# Patient Record
Sex: Male | Born: 1950 | Race: White | Hispanic: No | Marital: Married | Smoking: Never smoker
Health system: Southern US, Community
[De-identification: ages and names within clinical notes are randomized; demographics above are authoritative.]

## PROBLEM LIST (undated history)

## (undated) DIAGNOSIS — N3289 Other specified disorders of bladder: Secondary | ICD-10-CM

## (undated) DIAGNOSIS — I739 Peripheral vascular disease, unspecified: Secondary | ICD-10-CM

## (undated) DIAGNOSIS — I2510 Atherosclerotic heart disease of native coronary artery without angina pectoris: Secondary | ICD-10-CM

## (undated) DIAGNOSIS — G4733 Obstructive sleep apnea (adult) (pediatric): Secondary | ICD-10-CM

## (undated) DIAGNOSIS — Z8709 Personal history of other diseases of the respiratory system: Secondary | ICD-10-CM

## (undated) DIAGNOSIS — I219 Acute myocardial infarction, unspecified: Secondary | ICD-10-CM

## (undated) DIAGNOSIS — Z9989 Dependence on other enabling machines and devices: Secondary | ICD-10-CM

## (undated) DIAGNOSIS — J449 Chronic obstructive pulmonary disease, unspecified: Secondary | ICD-10-CM

## (undated) DIAGNOSIS — E119 Type 2 diabetes mellitus without complications: Secondary | ICD-10-CM

## (undated) HISTORY — PX: COLONOSCOPY W/ POLYPECTOMY: SHX1380

---

## 2004-05-04 DIAGNOSIS — I739 Peripheral vascular disease, unspecified: Secondary | ICD-10-CM

## 2004-05-04 DIAGNOSIS — I219 Acute myocardial infarction, unspecified: Secondary | ICD-10-CM

## 2004-05-04 HISTORY — DX: Peripheral vascular disease, unspecified: I73.9

## 2004-05-04 HISTORY — PX: ANGIOPLASTY: SHX39

## 2004-05-04 HISTORY — DX: Acute myocardial infarction, unspecified: I21.9

## 2017-04-13 HISTORY — PX: OTHER SURGICAL HISTORY: SHX169

## 2017-08-16 DIAGNOSIS — G4733 Obstructive sleep apnea (adult) (pediatric): Secondary | ICD-10-CM

## 2017-08-16 DIAGNOSIS — Z9989 Dependence on other enabling machines and devices: Secondary | ICD-10-CM

## 2017-08-16 HISTORY — DX: Obstructive sleep apnea (adult) (pediatric): G47.33

## 2017-08-16 HISTORY — DX: Dependence on other enabling machines and devices: Z99.89

## 2017-11-01 DIAGNOSIS — Z8709 Personal history of other diseases of the respiratory system: Secondary | ICD-10-CM

## 2017-11-01 HISTORY — DX: Personal history of other diseases of the respiratory system: Z87.09

## 2017-11-01 HISTORY — PX: THORACENTESIS: SHX235

## 2017-11-06 ENCOUNTER — Encounter (HOSPITAL_COMMUNITY): Payer: Self-pay | Admitting: Internal Medicine

## 2017-11-06 ENCOUNTER — Other Ambulatory Visit: Payer: Self-pay

## 2017-11-06 ENCOUNTER — Inpatient Hospital Stay (HOSPITAL_COMMUNITY)
Admission: AD | Admit: 2017-11-06 | Discharge: 2017-11-07 | DRG: 920 | Disposition: A | Discharge status: Home / Self Care | Payer: Medicare Other | Source: Other Acute Inpatient Hospital | Attending: Family Medicine | Admitting: Family Medicine

## 2017-11-06 DIAGNOSIS — N329 Bladder disorder, unspecified: Secondary | ICD-10-CM | POA: Diagnosis present

## 2017-11-06 DIAGNOSIS — I10 Essential (primary) hypertension: Secondary | ICD-10-CM | POA: Diagnosis present

## 2017-11-06 DIAGNOSIS — Z955 Presence of coronary angioplasty implant and graft: Secondary | ICD-10-CM

## 2017-11-06 DIAGNOSIS — K91840 Postprocedural hemorrhage and hematoma of a digestive system organ or structure following a digestive system procedure: Principal | ICD-10-CM | POA: Diagnosis present

## 2017-11-06 DIAGNOSIS — N3289 Other specified disorders of bladder: Secondary | ICD-10-CM | POA: Diagnosis present

## 2017-11-06 DIAGNOSIS — Z794 Long term (current) use of insulin: Secondary | ICD-10-CM | POA: Diagnosis not present

## 2017-11-06 DIAGNOSIS — Z95810 Presence of automatic (implantable) cardiac defibrillator: Secondary | ICD-10-CM

## 2017-11-06 DIAGNOSIS — Y838 Other surgical procedures as the cause of abnormal reaction of the patient, or of later complication, without mention of misadventure at the time of the procedure: Secondary | ICD-10-CM | POA: Diagnosis not present

## 2017-11-06 DIAGNOSIS — Z79899 Other long term (current) drug therapy: Secondary | ICD-10-CM

## 2017-11-06 DIAGNOSIS — N179 Acute kidney failure, unspecified: Secondary | ICD-10-CM | POA: Diagnosis present

## 2017-11-06 DIAGNOSIS — Z7901 Long term (current) use of anticoagulants: Secondary | ICD-10-CM | POA: Diagnosis not present

## 2017-11-06 DIAGNOSIS — J449 Chronic obstructive pulmonary disease, unspecified: Secondary | ICD-10-CM | POA: Diagnosis present

## 2017-11-06 DIAGNOSIS — Z7951 Long term (current) use of inhaled steroids: Secondary | ICD-10-CM

## 2017-11-06 DIAGNOSIS — E119 Type 2 diabetes mellitus without complications: Secondary | ICD-10-CM | POA: Diagnosis not present

## 2017-11-06 DIAGNOSIS — J302 Other seasonal allergic rhinitis: Secondary | ICD-10-CM | POA: Diagnosis not present

## 2017-11-06 DIAGNOSIS — K922 Gastrointestinal hemorrhage, unspecified: Secondary | ICD-10-CM | POA: Diagnosis present

## 2017-11-06 DIAGNOSIS — K7689 Other specified diseases of liver: Secondary | ICD-10-CM | POA: Diagnosis present

## 2017-11-06 DIAGNOSIS — K921 Melena: Secondary | ICD-10-CM | POA: Diagnosis present

## 2017-11-06 DIAGNOSIS — I252 Old myocardial infarction: Secondary | ICD-10-CM

## 2017-11-06 DIAGNOSIS — Z7982 Long term (current) use of aspirin: Secondary | ICD-10-CM | POA: Diagnosis not present

## 2017-11-06 DIAGNOSIS — I2510 Atherosclerotic heart disease of native coronary artery without angina pectoris: Secondary | ICD-10-CM | POA: Diagnosis present

## 2017-11-06 DIAGNOSIS — Z888 Allergy status to other drugs, medicaments and biological substances status: Secondary | ICD-10-CM | POA: Diagnosis not present

## 2017-11-06 DIAGNOSIS — J90 Pleural effusion, not elsewhere classified: Secondary | ICD-10-CM | POA: Diagnosis present

## 2017-11-06 DIAGNOSIS — Z9889 Other specified postprocedural states: Secondary | ICD-10-CM

## 2017-11-06 HISTORY — DX: Obstructive sleep apnea (adult) (pediatric): G47.33

## 2017-11-06 HISTORY — DX: Dependence on other enabling machines and devices: Z99.89

## 2017-11-06 HISTORY — DX: Type 2 diabetes mellitus without complications: E11.9

## 2017-11-06 HISTORY — DX: Atherosclerotic heart disease of native coronary artery without angina pectoris: I25.10

## 2017-11-06 HISTORY — DX: Chronic obstructive pulmonary disease, unspecified: J44.9

## 2017-11-06 LAB — CBC
HEMATOCRIT: 30.7 % — AB (ref 39.0–52.0)
Hemoglobin: 9.9 g/dL — ABNORMAL LOW (ref 13.0–17.0)
MCH: 28.7 pg (ref 26.0–34.0)
MCHC: 32.2 g/dL (ref 30.0–36.0)
MCV: 89 fL (ref 78.0–100.0)
Platelets: 130 10*3/uL — ABNORMAL LOW (ref 150–400)
RBC: 3.45 MIL/uL — ABNORMAL LOW (ref 4.22–5.81)
RDW: 14.3 % (ref 11.5–15.5)
WBC: 6.9 10*3/uL (ref 4.0–10.5)

## 2017-11-06 LAB — GLUCOSE, CAPILLARY
GLUCOSE-CAPILLARY: 185 mg/dL — AB (ref 70–99)
GLUCOSE-CAPILLARY: 165 mg/dL — AB (ref 70–99)
Glucose-Capillary: 155 mg/dL — ABNORMAL HIGH (ref 70–99)

## 2017-11-06 LAB — PROTIME-INR
INR: 1.83
Prothrombin Time: 21 seconds — ABNORMAL HIGH (ref 11.4–15.2)

## 2017-11-06 LAB — MRSA PCR SCREENING: MRSA by PCR: NEGATIVE

## 2017-11-06 MED ORDER — PANTOPRAZOLE SODIUM 40 MG IV SOLR
40.0000 mg | Freq: Two times a day (BID) | INTRAVENOUS | Status: DC
  Administered 2017-11-06 (×2): 40 mg via INTRAVENOUS
  Filled 2017-11-06 (×5): qty 40

## 2017-11-06 MED ORDER — INSULIN ASPART 100 UNIT/ML ~~LOC~~ SOLN
0.0000 [IU] | Freq: Three times a day (TID) | SUBCUTANEOUS | Status: DC
  Administered 2017-11-06: 2 [IU] via SUBCUTANEOUS
  Administered 2017-11-07: 5 [IU] via SUBCUTANEOUS
  Administered 2017-11-07: 2 [IU] via SUBCUTANEOUS

## 2017-11-06 MED ORDER — FAMOTIDINE IN NACL 20-0.9 MG/50ML-% IV SOLN
20.0000 mg | Freq: Two times a day (BID) | INTRAVENOUS | Status: DC
Start: 2017-11-06 — End: 2017-11-06

## 2017-11-06 NOTE — H&P (Signed)
History and Physical    Nathaniel Adams BJY:[id number] DOB: October 20, 1950 DOA: 11/06/2017  PCP: Patient, No Pcp Per Patient coming from: HOME  Chief Complaint: Blood in stool  HPI: Nathaniel Adams is a 67 y.o. male with medical history significant of CAD,COPD,DEFIB PACER,DM transferred from [facility] emergency room where he went with complaints of 2 bowel movements that contained bright red blood.  patient has had no further bowel movements since yesterday.  He denies any prior episodes like this in the past.  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  He follows up with Dr. Rosemarie Ax  at the [facility] in [place] for cardiology.  He denies any nausea vomiting hematemesis.  He denies taking Motrin and ibuprofen.  However he does take aspirin 81 mg daily along with Coumadin at home.  He has no history of peptic ulcer disease in the past.  He had a colonoscopy on June 19 by Dr. Melina Copa in [place] he was told that he had 10 polyps.  He was also told that some of the polyps were removed.  Coumadin was on hold for 7 days at that time.  He denies any fever, chills, shortness of breath, headaches, abdominal pain, urinary complaints.  He complains of a dry cough.  His current medications are atorvastatin 80 mg daily, carvedilol 25 mg 1-1/2 tablets twice a day, vitamin D3, digoxin 0.125 MCG daily, fluticasone nasal spray, Lasix 20 mg once a day, NovoLog insulin 10 units before breakfast and 15 units before lunch and 12 units before supper, Lantus 38 units at bedtime, lovastatin 10 mg daily, metformin thousand milligrams twice a day, spironolactone 25 mg daily, valsartan 80 mg daily, Coumadin 5 mg except take 1-1/2 tablets on Wednesday.  ED Course: Patient was given Protonix and IV fluids.  CT of the abdomen and pelvis 3.8 cm polypoid mass in the left posterior bladder suspicious for carcinoma, several hyperenhancing nodules within  the right lobe of the liver further evaluation with a dedicated abdominal MRI should be considered.  Cardiomegaly with small right pleural effusion and moderate left pleural effusion.  Small amount of radiodense material within the stomach.  Sigmoid colon diverticula without acute inflammation.  Mild to moderate aortic atherosclerosis without aneurysm or dissection.  No significant vascular stenosis within the abdomen or pelvis.  Blood pressure was 148/66 pulse was 74 respiration 18 pulse ox 98% on room air temperature 97.8.  Globin was 10.2 came down to 9.5 when repeated while in the ER.  Sodium 136 potassium 5.2 BUN 35 creatinine 1.40.  Count is 142.  Review of Systems: Positive for generalized weakness Ambulatory Status: Patient is ambulatory at baseline and does all his ADLs by himself he is retired.  Past Medical History:  Diagnosis Date  . CAD (coronary artery disease)   . COPD (chronic obstructive pulmonary disease) (HCC)       Social History   Socioeconomic History  . Marital status: Married    Spouse name: Not on file  . Number of children: Not on file  . Years of education: Not on file  . Highest education level: Not on file  Occupational History  . Not on file  Social Needs  . Financial resource strain: Not on file  . Food insecurity:    Worry: Not on file    Inability: Not on file  . Transportation needs:    Medical:  Not on file    Non-medical: Not on file  Tobacco Use  . Smoking status: Not on file  Substance and Sexual Activity  . Alcohol use: Not on file  . Drug use: Not on file  . Sexual activity: Not on file  Lifestyle  . Physical activity:    Days per week: Not on file    Minutes per session: Not on file  . Stress: Not on file  Relationships  . Social connections:    Talks on phone: Not on file    Gets together: Not on file    Attends religious service: Not on file    Active member of club or organization: Not on file    Attends meetings of clubs or  organizations: Not on file    Relationship status: Not on file  . Intimate partner violence:    Fear of current or ex partner: Not on file    Emotionally abused: Not on file    Physically abused: Not on file    Forced sexual activity: Not on file  Other Topics Concern  . Not on file  Social History Narrative  . Not on file    Allergies not on file  No family history on file.    Prior to Admission medications   Not on File    Physical Exam: Vitals:   11/06/17 1153  BP: 130/71  Pulse: 75  Resp: 18  Temp: 98.1 F (36.7 C)  TempSrc: Oral  Weight: 46.2 kg (101 lb 12.8 oz)  Height: 5\' 9"  (1.753 m)     General: Appears calm and comfortable Eyes:  PERRL, EOMI, normal lids, iris ENT:  grossly normal hearing, lips & tongue, mmm Neck:  no LAD, masses or thyromegaly Cardiovascular:  RRR, no m/r/g. No LE edema. PACER LEFT UPPER CHEST PROTRUDED. Respiratory:  CTA bilaterally, no w/r/r. Normal respiratory effort. Abdomen:  soft, ntnd, NABS Skin:  no rash or induration seen on limited exam Musculoskeletal:  grossly normal tone BUE/BLE, good ROM, no bony abnormality Psychiatric:  grossly normal mood and affect, speech fluent and appropriate, AOx3 Neurologic:  CN 2-12 grossly intact, moves all extremities in coordinated fashion, sensation intact  Labs on Admission: I have personally reviewed following labs and imaging studies  CBC: No results for input(s): WBC, NEUTROABS, HGB, HCT, MCV, PLT in the last 168 hours. Basic Metabolic Panel: No results for input(s): NA, K, CL, CO2, GLUCOSE, BUN, CREATININE, CALCIUM, MG, PHOS in the last 168 hours. GFR: CrCl cannot be calculated (No order found.). Liver Function Tests: No results for input(s): AST, ALT, ALKPHOS, BILITOT, PROT, ALBUMIN in the last 168 hours. No results for input(s): LIPASE, AMYLASE in the last 168 hours. No results for input(s): AMMONIA in the last 168 hours. Coagulation Profile: No results for input(s): INR,  PROTIME in the last 168 hours. Cardiac Enzymes: No results for input(s): CKTOTAL, CKMB, CKMBINDEX, TROPONINI in the last 168 hours. BNP (last 3 results) No results for input(s): PROBNP in the last 8760 hours. HbA1C: No results for input(s): HGBA1C in the last 72 hours. CBG: No results for input(s): GLUCAP in the last 168 hours. Lipid Profile: No results for input(s): CHOL, HDL, LDLCALC, TRIG, CHOLHDL, LDLDIRECT in the last 72 hours. Thyroid Function Tests: No results for input(s): TSH, T4TOTAL, FREET4, T3FREE, THYROIDAB in the last 72 hours. Anemia Panel: No results for input(s): VITAMINB12, FOLATE, FERRITIN, TIBC, IRON, RETICCTPCT in the last 72 hours. Urine analysis: No results found for: COLORURINE, APPEARANCEUR, [place], [place], [place],  HGBUR, BILIRUBINUR, KETONESUR, PROTEINUR, UROBILINOGEN, NITRITE, LEUKOCYTESUR  Creatinine Clearance: CrCl cannot be calculated (No order found.).  Sepsis Labs: @LABRCNTIP (procalcitonin:4,lacticidven:4) )No results found for this or any previous visit (from the past 240 hour(s)).   Radiological Exams on Admission: No results found.  EKG: Independently reviewed.   Assessment/Plan Active Problems:   Upper GI bleed   1] GI bleed-patient admitted with melanotic stools.  He has no history of this in the past.  However he had colonoscopy June 19 and he feels may be some polyps were removed.  Consider  post polypectomy bleed.  I will hold his Coumadin and start him on IV Protonix.  Consult called into Dr. Alessandra Bevels.  I will place him on clear liquids at this time.  Check CBC INR BMP now.  2] history of CAD restart home medications  3] hypertension patient reports he is allergic to ACE inhibitors makes him cough.Restart carvidilol,digoxin.hold lasix.  4] seasonal allergies and takes Allegra at home.  5]bladder mass discussed with urology dr Jeffie Pollock he will see patient .  6]AKI hold metformin,  spironolactone, valsartan.  Follow-up levels  tomorrow.  7] liver nodules question metastatic.  Will need liver biopsy once GI bleeding resolved.  8] pleural effusion will schedule with IR to do a agnostic and therapeutic thoracentesis.  With the above findings of liver nodules and bladder mass rule out malignancy.  9] type 2 diabetes hold home insulin and start him on sliding scale insulin ~work-up is finished.  10] history of CAD and stent pacemaker and defibrillator patient takes Coumadin at home for this reason.  May need to obtain a cardiology consult on Monday to see if he really needs a Coumadin.  Hospital at [facility] to see his cardiologist.     DVT prophylaxis: SCD Code Status: Full code Family Communication: Discussed with his wife Disposition Plan: TBD Consults called: GI Admission status: Inpatient   Georgette Shell MD [facility]  If 7PM-7AM, please contact night-coverage [email or web address] Password [email or web address]  11/06/2017, 12:38 PM

## 2017-11-06 NOTE — Consult Note (Addendum)
Referring Provider:  [facility] Primary Care Physician:  Patient, No Pcp Per Primary Gastroenterologist:  Dr. Nance Pear  Reason for Consultation:  melena  HPI: Nathaniel Adams is a 67 y.o. male with past medical history of coronary artery disease, COPD, history of chronic anticoagulation with Coumadin transferred from [facility] for further evaluation of melanotic stools.  Patient seen and examined at bedside. He underwent Colonoscopy 10/20/2017 - Dr. Ashok Cordia . He was found to have more than 10 polyps. He had 1 large polyp which was removed and follow-up colonoscopy in 3 months is recommended to document complete polypectomy. His Coumadin was on hold for next 3 days after procedure. He was doing fine until yesterday when he noted bright red blood per rectum followed by 1 episode of dark color of stool. He denies nausea or vomiting. Denies any abdominal pain. Denies diarrhea or constipation. No history of ulcer disease. Denied NSAID use. Denies dysphagia or odynophagia.  CT scan abd/pelvis with IV contrast 07/05 showed no evidence of active extravasation within the bowel .3.8 cm mass in the bladder, concerning for carcinoma  as well as several hyper enhancing nodules within he tight lobe of the liver.  Hgb 10.2 on 07/05. INR 2.0  . Normal LFts. PLT 122 07/06 , 142 on 07/05)  Past Medical History:  Diagnosis Date  . CAD (coronary artery disease)   . COPD (chronic obstructive pulmonary disease) (HCC)    PSH : colonoscopy 10/20/2017  Family history. No family history of colon cancer.   Prior to Admission medications   Not on File    Scheduled Meds: . insulin aspart  0-9 Units Subcutaneous TID WC  . pantoprazole (PROTONIX) IV  40 mg Intravenous Q12H   Continuous Infusions:  PRN Meds:.  Allergies as of 11/06/2017  . (Not on File)    No family history on file.  Social History   Socioeconomic History  . Marital status: Married    Spouse name: Not on file  . Number of  children: Not on file  . Years of education: Not on file  . Highest education level: Not on file  Occupational History  . Not on file  Social Needs  . Financial resource strain: Not on file  . Food insecurity:    Worry: Not on file    Inability: Not on file  . Transportation needs:    Medical: Not on file    Non-medical: Not on file  Tobacco Use  . Smoking status: Not on file  Substance and Sexual Activity  . Alcohol use: Not on file  . Drug use: Not on file  . Sexual activity: Not on file  Lifestyle  . Physical activity:    Days per week: Not on file    Minutes per session: Not on file  . Stress: Not on file  Relationships  . Social connections:    Talks on phone: Not on file    Gets together: Not on file    Attends religious service: Not on file    Active member of club or organization: Not on file    Attends meetings of clubs or organizations: Not on file    Relationship status: Not on file  . Intimate partner violence:    Fear of current or ex partner: Not on file    Emotionally abused: Not on file    Physically abused: Not on file    Forced sexual activity: Not on file  Other Topics Concern  . Not on file  Social History Narrative  . Not on file    Review of Systems: Review of Systems  Constitutional: Negative for chills and fever.  HENT: Negative for hearing loss and tinnitus.   Eyes: Negative for blurred vision and double vision.  Respiratory: Negative for cough and hemoptysis.   Cardiovascular: Negative for chest pain and palpitations.  Gastrointestinal: Positive for blood in stool and melena. Negative for abdominal pain, heartburn, nausea and vomiting.  Genitourinary: Negative for dysuria and urgency.  Musculoskeletal: Negative for myalgias and neck pain.  Skin: Negative for rash.  Neurological: Negative for dizziness and headaches.  Endo/Heme/Allergies: Does not bruise/bleed easily.  Psychiatric/Behavioral: Negative for hallucinations and suicidal  ideas.    Physical Exam: Vital signs: Vitals:   11/06/17 1153  BP: 130/71  Pulse: 75  Resp: 18  Temp: 98.1 F (36.7 C)   Last BM Date: 11/05/17 Physical Exam  Constitutional: He is oriented to person, place, and time. He appears well-developed and well-nourished.  HENT:  Mouth/Throat: Oropharynx is clear and moist. No oropharyngeal exudate.  Eyes: EOM are normal. No scleral icterus.  Neck: Normal range of motion. Neck supple. No thyromegaly present.  Cardiovascular: Normal rate and regular rhythm.  Pulmonary/Chest: Effort normal and breath sounds normal. No respiratory distress.  Abdominal: Soft. Bowel sounds are normal. He exhibits no distension. There is no tenderness. There is no rebound and no guarding.  Musculoskeletal: Normal range of motion. He exhibits no edema.  Neurological: He is alert and oriented to person, place, and time.  Skin: Skin is warm. No erythema.  Psychiatric: He has a normal mood and affect. Judgment normal.  Vitals reviewed.   GI:  Lab Results: Recent Labs    11/06/17 1225  WBC 6.9  HGB 9.9*  HCT 30.7*  PLT 130*   BMET No results for input(s): NA, K, CL, CO2, GLUCOSE, BUN, CREATININE, CALCIUM in the last 72 hours. LFT No results for input(s): PROT, ALBUMIN, AST, ALT, ALKPHOS, BILITOT, BILIDIR, IBILI in the last 72 hours. PT/INR Recent Labs    11/06/17 1225  LABPROT 21.0*  INR 1.83     Studies/Results: No results found.  Impression/Plan: - bright red blood per rectum followed by 1 episode of black color stool in a patient who had colonoscopy on 10/20/2017 with resection of large polyp and multiple polyps. He was recommended for surveillance colonoscopy in 3 months to document complete polypectomy. Most likely post-polypectomy bleeding. - Chronic anticoagulation use. Coumadin currently on hold. - history of coronary artery disease - abnormal CT scan showing multiple liver lesions. - 3.8 cm mass in the bladder, concerning for  carcinoma  Recommendations ------------------------ - Continue conservative management for now. - Okay to start full liquid diet. - monitor H&H and INR. - If hemoglobin remains stable, hopefully we will be able to restart Coumadin from tomorrow. - Recommend outpatient MRI abdomen with contrast for further evaluation of liver lesion next few months. - Continue PPI for now.Most likely he has a post-polypectomy bleeding.  Records from [facility] reviewed from the paper chart. I do not have the colonoscopy report available to review. - GI will follow   LOS: 0 days   Otis Brace  MD, FACP 11/06/2017, 1:53 PM  Contact #  [phone]

## 2017-11-07 ENCOUNTER — Encounter (HOSPITAL_COMMUNITY): Payer: Self-pay | Admitting: *Deleted

## 2017-11-07 ENCOUNTER — Inpatient Hospital Stay (HOSPITAL_COMMUNITY): Payer: Medicare Other

## 2017-11-07 DIAGNOSIS — K91840 Postprocedural hemorrhage and hematoma of a digestive system organ or structure following a digestive system procedure: Secondary | ICD-10-CM | POA: Diagnosis not present

## 2017-11-07 DIAGNOSIS — K922 Gastrointestinal hemorrhage, unspecified: Secondary | ICD-10-CM

## 2017-11-07 DIAGNOSIS — K921 Melena: Secondary | ICD-10-CM | POA: Diagnosis not present

## 2017-11-07 DIAGNOSIS — N3289 Other specified disorders of bladder: Secondary | ICD-10-CM | POA: Diagnosis not present

## 2017-11-07 DIAGNOSIS — J449 Chronic obstructive pulmonary disease, unspecified: Secondary | ICD-10-CM | POA: Diagnosis present

## 2017-11-07 LAB — GLUCOSE, CAPILLARY
GLUCOSE-CAPILLARY: 266 mg/dL — AB (ref 70–99)
Glucose-Capillary: 178 mg/dL — ABNORMAL HIGH (ref 70–99)

## 2017-11-07 LAB — CBC
HEMATOCRIT: 29.8 % — AB (ref 39.0–52.0)
Hemoglobin: 9.6 g/dL — ABNORMAL LOW (ref 13.0–17.0)
MCH: 28.5 pg (ref 26.0–34.0)
MCHC: 32.2 g/dL (ref 30.0–36.0)
MCV: 88.4 fL (ref 78.0–100.0)
Platelets: 140 10*3/uL — ABNORMAL LOW (ref 150–400)
RBC: 3.37 MIL/uL — ABNORMAL LOW (ref 4.22–5.81)
RDW: 14 % (ref 11.5–15.5)
WBC: 6.6 10*3/uL (ref 4.0–10.5)

## 2017-11-07 LAB — COMPREHENSIVE METABOLIC PANEL
ALK PHOS: 62 U/L (ref 38–126)
ALT: 14 U/L (ref 0–44)
AST: 44 U/L — ABNORMAL HIGH (ref 15–41)
Albumin: 3.6 g/dL (ref 3.5–5.0)
Anion gap: 7 (ref 5–15)
BUN: 26 mg/dL — ABNORMAL HIGH (ref 8–23)
CALCIUM: 8.9 mg/dL (ref 8.9–10.3)
CO2: 26 mmol/L (ref 22–32)
Chloride: 102 mmol/L (ref 98–111)
Creatinine, Ser: 1.39 mg/dL — ABNORMAL HIGH (ref 0.61–1.24)
GFR, EST AFRICAN AMERICAN: 59 mL/min — AB (ref >60)
GFR, EST NON AFRICAN AMERICAN: 51 mL/min — AB (ref >60)
Glucose, Bld: 171 mg/dL — ABNORMAL HIGH (ref 70–99)
Potassium: 4.9 mmol/L (ref 3.5–5.1)
Sodium: 135 mmol/L (ref 135–145)
Total Bilirubin: 1 mg/dL (ref 0.3–1.2)
Total Protein: 6.1 g/dL — ABNORMAL LOW (ref 6.5–8.1)

## 2017-11-07 LAB — BODY FLUID CELL COUNT WITH DIFFERENTIAL
Lymphs, Fluid: 100 %
Monocyte-Macrophage-Serous Fluid: 0 % — ABNORMAL LOW (ref 50–90)
NEUTROPHIL FLUID: 0 % (ref 0–25)
Total Nucleated Cell Count, Fluid: 775 cu mm (ref 0–1000)

## 2017-11-07 LAB — LACTATE DEHYDROGENASE, PLEURAL OR PERITONEAL FLUID: LD, Fluid: 44 U/L — ABNORMAL HIGH (ref 3–23)

## 2017-11-07 LAB — HEMOGLOBIN AND HEMATOCRIT, BLOOD
HEMATOCRIT: 30.2 % — AB (ref 39.0–52.0)
HEMOGLOBIN: 9.8 g/dL — AB (ref 13.0–17.0)

## 2017-11-07 LAB — HIV ANTIBODY (ROUTINE TESTING W REFLEX): HIV Screen 4th Generation wRfx: NONREACTIVE

## 2017-11-07 LAB — PROTIME-INR
INR: 1.66
Prothrombin Time: 19.4 seconds — ABNORMAL HIGH (ref 11.4–15.2)

## 2017-11-07 LAB — GLUCOSE, PLEURAL OR PERITONEAL FLUID: Glucose, Fluid: 211 mg/dL

## 2017-11-07 MED ORDER — LIDOCAINE HCL (PF) 1 % IJ SOLN
INTRAMUSCULAR | Status: AC
  Filled 2017-11-07: qty 10

## 2017-11-07 MED ORDER — PANTOPRAZOLE SODIUM 40 MG PO TBEC
40.0000 mg | DELAYED_RELEASE_TABLET | Freq: Every day | ORAL | Status: DC
  Administered 2017-11-07: 40 mg via ORAL
  Filled 2017-11-07: qty 1

## 2017-11-07 NOTE — Discharge Summary (Signed)
Physician Discharge Summary  Nathaniel Adams:[id number] DOB: [birth year] DOA: 11/06/2017  PCP: Patient, No Pcp Per  Admit date: 11/06/2017 Discharge date: 11/07/2017  Time spent: 35 minutes  Recommendations for Outpatient Follow-up:  1. Follow-up on thoracentesis work-up for possible malignancy in etiology 2. Holding Coumadin for cystoscopy with biopsy of bladder mass with Dr. Roni Bread this week 3. Patient needs outpatient MRI with contrast for further evaluation of liver lesion per GI   Discharge Diagnoses:  Principal Problem:   Upper GI bleed Active Problems:   Bladder mass   DM (diabetes mellitus), type 2 (HCC)   COPD (chronic obstructive pulmonary disease) ([place])   Discharge Condition: Stable and improved  Diet recommendation: Healthy heart  Filed Weights   11/06/17 1153  Weight: 46.2 kg (101 lb 12.8 oz)    History of present illness and hospital course 67 year old male presented to the emergency department with melanotic stools likely secondary to post polypectomy after colonoscopy done June 19 for which he had 10 polyps removed.  GI was consulted and due to patient's stability in his hemoglobin which was almost 10 throughout his hospitalization no further work-up was advised.  Patient's Coumadin was held.  Patient also incidentally found to have a bladder mass.  Spoken to Dr. Roni Bread with urology will arrange for him to follow-up in outpatient setting in order to get a cystoscopy with biopsy.  For this reason I will continue to hold his Coumadin.  Patient is instructed to follow-up with his primary care physician in approximately a week to resume his Coumadin at that point.  Patient is being discharged in stable and improved condition.   Discharge Exam: Vitals:   11/07/17 1011 11/07/17 1150  BP: 139/68 133/67  Pulse: 84 67  Resp:  18  Temp:  97.6 F (36.4 C)  SpO2:  98%    General: Alert and oriented x4 no apparent distress cooperative and friendly Cardiovascular: Regular  rate and rhythm without murmurs rubs or gallops Respiratory: Clear to auscultation bilaterally no wheezes rhonchi rales  Discharge Instructions   Discharge Instructions    Diet - low sodium heart healthy   Complete by:  As directed    Discharge instructions   Complete by:  As directed    Continue to hold coumadin , dr Roni Bread our urologist will arrange for follow up for biopsy in his office this week   Increase activity slowly   Complete by:  As directed      Allergies as of 11/07/2017   Not on File     Medication List    STOP taking these medications   warfarin 5 MG tablet Commonly known as:  COUMADIN     TAKE these medications   acetaminophen 500 MG tablet Commonly known as:  TYLENOL Take 1,000 mg by mouth every 8 (eight) hours as needed for mild pain or headache.   atorvastatin 80 MG tablet Commonly known as:  LIPITOR Take 40 mg by mouth daily at 6 PM.   carvedilol 25 MG tablet Commonly known as:  COREG Take 37.5 mg by mouth 2 (two) times daily with a meal.   cholecalciferol 1000 units tablet Commonly known as:  VITAMIN D Take 1,000 Units by mouth daily.   digoxin 0.125 MG tablet Commonly known as:  LANOXIN Take 0.125 mg by mouth daily.   fluticasone 50 MCG/ACT nasal spray Commonly known as:  FLONASE Place 2 sprays into both nostrils daily.   furosemide 20 MG tablet Commonly known as:  LASIX Take  20 mg by mouth daily.   insulin aspart 100 UNIT/ML injection Commonly known as:  novoLOG Inject 10-15 Units into the skin See admin instructions. Taking 10 units in the AM before Breakfast, 15 units at Lunch and 12 units in the PM.   insulin glargine 100 UNIT/ML injection Commonly known as:  LANTUS Inject 38 Units into the skin at bedtime.   loratadine 10 MG tablet Commonly known as:  CLARITIN Take 10 mg by mouth daily.   metFORMIN 1000 MG tablet Commonly known as:  GLUCOPHAGE Take 1,000 mg by mouth 2 (two) times daily with a meal.   spironolactone 25 MG  tablet Commonly known as:  ALDACTONE Take 25 mg by mouth daily.   valsartan 80 MG tablet Commonly known as:  DIOVAN Take 40 mg by mouth 2 (two) times daily.      Not on File Follow-up Information    Irine Seal, MD.   Specialty:  Urology Why:  Please call my office in the morning to arrange and appointment.  I will try to get it set up for you to be see on Monday or Tuesday. Contact information: [address] [place] [address] [phone]        PCP Follow up in 1 week(s).            The results of significant diagnostics from this hospitalization (including imaging, microbiology, ancillary and laboratory) are listed below for reference.    Significant Diagnostic Studies: Dg Chest 1 View  Result Date: 11/07/2017 CLINICAL DATA:  Post left-sided thoracentesis. EXAM: CHEST  1 VIEW COMPARISON:  CT abdomen and pelvis - 11/06/2017 FINDINGS: Grossly unchanged cardiac silhouette and mediastinal contours. Stable positioning of support apparatus. Interval reduction/resolution of left-sided pleural effusion post thoracentesis. Unchanged small right-sided pleural effusion. Persistent bibasilar heterogeneous/consolidative opacities. Pulmonary vasculature remains indistinct with cephalization of flow. No pneumothorax. No acute osseus abnormalities. IMPRESSION: 1. Interval reduction/resolution of left-sided pleural effusion post thoracentesis. No pneumothorax. 2. Similar findings suggestive of pulmonary edema with small residual right-sided effusion and associated bibasilar opacities, likely atelectasis. Electronically Signed   By: Sandi Mariscal M.D.   On: 11/07/2017 10:29   US Thoracentesis Asp Pleural Space W/img Guide  Result Date: 11/07/2017 INDICATION: Patient with history of GI bleed, coronary artery disease, bladder mass, liver lesions, bilateral pleural effusions left greater than right. Request made for diagnostic and therapeutic left thoracentesis. EXAM: ULTRASOUND GUIDED DIAGNOSTIC  AND THERAPEUTIC LEFT THORACENTESIS MEDICATIONS: None COMPLICATIONS: None immediate. PROCEDURE: An ultrasound guided thoracentesis was thoroughly discussed with the patient and questions answered. The benefits, risks, alternatives and complications were also discussed. The patient understands and wishes to proceed with the procedure. Written consent was obtained. Ultrasound was performed to localize and mark an adequate pocket of fluid in the left chest. The area was then prepped and draped in the normal sterile fashion. 1% Lidocaine was used for local anesthesia. Under ultrasound guidance a 6 Fr Safe-T-Centesis catheter was introduced. Thoracentesis was performed. The catheter was removed and a dressing applied. FINDINGS: A total of approximately 1.2 liters of hazy, amber fluid was removed. Samples were sent to the laboratory as requested by the clinical team. IMPRESSION: Successful ultrasound guided diagnostic and therapeutic left thoracentesis yielding 1.2 liters of pleural fluid. Read by: Rowe Robert, PA-C Electronically Signed   By: Sandi Mariscal M.D.   On: 11/07/2017 10:09    Microbiology: Recent Results (from the past 240 hour(s))  MRSA PCR Screening     Status: None   Collection  Time: 11/06/17  1:07 PM  Result Value Ref Range Status   MRSA by PCR NEGATIVE NEGATIVE Final    Comment:        The GeneXpert MRSA Assay (FDA approved for NASAL specimens only), is one component of a comprehensive MRSA colonization surveillance program. It is not intended to diagnose MRSA infection nor to guide or monitor treatment for MRSA infections. Performed at [facility], [place] [address] [place], [place] [address]   Body fluid culture     Status: None (Preliminary result)   Collection Time: 11/07/17 10:16 AM  Result Value Ref Range Status   Specimen Description PLEURAL  Final   Special Requests NONE  Final   Gram Stain   Final    WBC PRESENT,BOTH PMN AND MONONUCLEAR NO ORGANISMS SEEN CYTOSPIN  SMEAR Performed at [facility], [place] [address], [place], [place] [address]    Culture PENDING  Incomplete   Report Status PENDING  Incomplete     Labs: Basic Metabolic Panel: Recent Labs  Lab 11/07/17 0325  NA 135  K 4.9  CL 102  CO2 26  GLUCOSE 171*  BUN 26*  CREATININE 1.39*  CALCIUM 8.9   Liver Function Tests: Recent Labs  Lab 11/07/17 0325  AST 44*  ALT 14  ALKPHOS 62  BILITOT 1.0  PROT 6.1*  ALBUMIN 3.6   No results for input(s): LIPASE, AMYLASE in the last 168 hours. No results for input(s): AMMONIA in the last 168 hours. CBC: Recent Labs  Lab 11/06/17 1225 11/07/17 0325 11/07/17 1148  WBC 6.9 6.6  --   HGB 9.9* 9.6* 9.8*  HCT 30.7* 29.8* 30.2*  MCV 89.0 88.4  --   PLT 130* 140*  --    Cardiac Enzymes: No results for input(s): CKTOTAL, CKMB, CKMBINDEX, TROPONINI in the last 168 hours. BNP: BNP (last 3 results) No results for input(s): BNP in the last 8760 hours.  ProBNP (last 3 results) No results for input(s): PROBNP in the last 8760 hours.  CBG: Recent Labs  Lab 11/06/17 1316 11/06/17 1721 11/06/17 2124 11/07/17 0725 11/07/17 1134  GLUCAP 155* 185* 165* 178* 266*       Signed:  DAVID,RACHAL A MD.  [facility] 11/07/2017, 3:47 PM

## 2017-11-07 NOTE — Progress Notes (Signed)
[facility] Gastroenterology Progress Note  Nathaniel Adams 67 y.o. [birth year]  CC:  GI bleed   Subjective: no further bleeding episodes. Patient denies any GI symptoms. Denied black stool, bright blood per rectum. Denies nausea, vomiting, abdominal pain.  ROS : negative for chest pain and palpitation.   Objective: Vital signs in last 24 hours: Vitals:   11/07/17 0312 11/07/17 0749  BP: (!) 123/55 111/85  Pulse: 70 71  Resp: 16 20  Temp: 97.8 F (36.6 C) 97.7 F (36.5 C)  SpO2: 98% 97%    Physical Exam:  Gen. Alert/oriented 3. Not in acute distress Abdominal pain soft, nontender, nondistended, bowel sounds present. No peritoneal signs Lower extremity.no edema Psych- Mood and affect normal  Lab Results: Recent Labs    11/07/17 0325  NA 135  K 4.9  CL 102  CO2 26  GLUCOSE 171*  BUN 26*  CREATININE 1.39*  CALCIUM 8.9   Recent Labs    11/07/17 0325  AST 44*  ALT 14  ALKPHOS 62  BILITOT 1.0  PROT 6.1*  ALBUMIN 3.6   Recent Labs    11/06/17 1225 11/07/17 0325  WBC 6.9 6.6  HGB 9.9* 9.6*  HCT 30.7* 29.8*  MCV 89.0 88.4  PLT 130* 140*   Recent Labs    11/06/17 1225 11/07/17 0325  LABPROT 21.0* 19.4*  INR 1.83 1.66      Assessment/Plan: - bright red blood per rectum followed by 1 episode of black color stool in a patient who had colonoscopy on 10/20/2017 with resection of large polyp and multiple polyps. He was recommended for surveillance colonoscopy in 3 months to document complete polypectomy. Most likely post-polypectomy bleeding.resolved now. - Chronic anticoagulation use. Coumadin currently on hold. - history of coronary artery disease - abnormal CT scan showing multiple liver lesions. 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 Hemoglobin relatively stable. - Recommend outpatient MRI abdomen with contrast for further evaluation of liver lesion in 2  to 3  months. - okay to change PPI to by mouth once a day. - Awaiting urology evaluation as well as thoracentesis by IR.  - okay to advance diet from GI standpoint. - ok to resume Coumadin tomorrow from GI standpoint if no further evidence of bleeding. His Coumadin may be on hold for thoracentesis and possible urology evaluation.  - patient and family was advised to follow up with Dr. Melina Copa in [place] after discharge in 2-4 weeks. He was also advised to inform Dr. Ashok Cordia about his admission and need for MRI of the liver for further evaluation of liver lesions.  GI will sign off. Call us back if needed      Otis Brace MD, [place] 11/07/2017, 9:14 AM  Contact #  [phone]

## 2017-11-07 NOTE — Procedures (Signed)
Ultrasound-guided diagnostic and therapeutic left thoracentesis performed yielding 1.2 liters of hazy, amber fluid. No immediate complications. Follow-up chest x-ray pending.The fluid was sent to the lab for preordered studies.

## 2017-11-09 ENCOUNTER — Other Ambulatory Visit: Payer: Self-pay | Admitting: Urology

## 2017-11-10 LAB — BODY FLUID CULTURE: Culture: NO GROWTH

## 2017-11-18 NOTE — Progress Notes (Signed)
cxr 11-07-17 epic

## 2017-11-18 NOTE — Patient Instructions (Addendum)
Nathaniel Adams  11/18/2017   Your procedure is scheduled on: 11-23-17   Report to [facility]  Entrance    Report to admitting at 7:00AM    Call this number if you have problems the morning of surgery [phone]    PLEASE BRING CPAP MASK AND TUBING . DEVICE WILL BE PROVIDED !   Remember: Do not eat food or drink liquids :After Midnight.     Take these medicines the morning of surgery with A SIP OF WATER: carvedilol, digoxin, nasal spray, loratadine                                 You may not have any metal on your body including hair pins and              piercings  Do not wear jewelry, make-up, lotions, powders or perfumes, deodorant                         Men may shave face and neck.   Do not bring valuables to the hospital. [place].  Contacts, dentures or bridgework may not be worn into surgery.  Leave suitcase in the car. After surgery it may be brought to your room.                 Please read over the following fact sheets you were given: _____________________________________________________________________    How to Manage Your Diabetes Before and After Surgery  Why is it important to control my blood sugar before and after surgery? . Improving blood sugar levels before and after surgery helps healing and can limit problems. . A way of improving blood sugar control is eating a healthy diet by: o  Eating less sugar and carbohydrates o  Increasing activity/exercise o  Talking with your doctor about reaching your blood sugar goals . High blood sugars (greater than 180 mg/dL) can raise your risk of infections and slow your recovery, so you will need to focus on controlling your diabetes during the weeks before surgery. . Make sure that the doctor who takes care of your diabetes knows about your planned surgery including the date and location.  How do I manage my blood sugar before  surgery? . Check your blood sugar at least 4 times a day, starting 2 days before surgery, to make sure that the level is not too high or low. o Check your blood sugar the morning of your surgery when you wake up and every 2 hours until you get to the Short Stay unit. . If your blood sugar is less than 70 mg/dL, you will need to treat for low blood sugar: o Do not take insulin. o Treat a low blood sugar (less than 70 mg/dL) with  cup of clear juice (cranberry or apple), 4 glucose tablets, OR glucose gel. o Recheck blood sugar in 15 minutes after treatment (to make sure it is greater than 70 mg/dL). If your blood sugar is not greater than 70 mg/dL on recheck, call [phone] for further instructions. . Report your blood sugar to the short stay nurse when you get to Short Stay.  . If you are admitted to the hospital after surgery:  o Your blood sugar will be checked by the staff and you will probably be given insulin after surgery (instead of oral diabetes medicines) to make sure you have good blood sugar levels. o The goal for blood sugar control after surgery is 80-180 mg/dL.   WHAT DO I DO ABOUT MY DIABETES MEDICATION?   . THE NIGHT BEFORE SURGERY o Do not take bedtime dose of NOVOLOG insulin o Only take half dose of LANTUS insulin  o Take metformin as normal       . THE MORNING OF SURGERY  o If your CBG is greater than 220 mg/dL, you may take  of your sliding scale  (correction) dose of NOVOLOG insulin. o Do not take metformin    Patient Signature:  Date:   Nurse Signature:  Date:   Reviewed and Endorsed by [facility] Patient Education Committee, August 2015            [facility] - Preparing for Surgery Before surgery, you can play an important role.  Because skin is not sterile, your skin needs to be as free of germs as possible.  You can reduce the number of germs on your skin by washing with CHG (chlorahexidine gluconate) soap before surgery.  CHG is an antiseptic  cleaner which kills germs and bonds with the skin to continue killing germs even after washing. Please DO NOT use if you have an allergy to CHG or antibacterial soaps.  If your skin becomes reddened/irritated stop using the CHG and inform your nurse when you arrive at Short Stay. Do not shave (including legs and underarms) for at least 48 hours prior to the first CHG shower.  You may shave your face/neck. Please follow these instructions carefully:  1.  Shower with CHG Soap the night before surgery and the  morning of Surgery.  2.  If you choose to wash your hair, wash your hair first as usual with your  normal  shampoo.  3.  After you shampoo, rinse your hair and body thoroughly to remove the  shampoo.                           4.  Use CHG as you would any other liquid soap.  You can apply chg directly  to the skin and wash                       Gently with a scrungie or clean washcloth.  5.  Apply the CHG Soap to your body ONLY FROM THE NECK DOWN.   Do not use on face/ open                           Wound or open sores. Avoid contact with eyes, ears mouth and genitals (private parts).                       Wash face,  Genitals (private parts) with your normal soap.             6.  Wash thoroughly, paying special attention to the area where your surgery  will be performed.  7.  Thoroughly rinse your body with warm water from the neck down.  8.  DO NOT shower/wash with your normal soap after using and rinsing off  the CHG Soap.  9.  Pat yourself dry with a clean towel.            10.  Wear clean pajamas.            11.  Place clean sheets on your bed the night of your first shower and do not  sleep with pets. Day of Surgery : Do not apply any lotions/deodorants the morning of surgery.  Please wear clean clothes to the hospital/surgery center.  FAILURE TO FOLLOW THESE INSTRUCTIONS MAY RESULT IN THE CANCELLATION OF YOUR SURGERY PATIENT  SIGNATURE_________________________________  NURSE SIGNATURE__________________________________  ________________________________________________________________________

## 2017-11-19 ENCOUNTER — Encounter (HOSPITAL_COMMUNITY)
Admission: RE | Admit: 2017-11-19 | Discharge: 2017-11-19 | Disposition: A | Discharge status: Home / Self Care | Payer: Medicare Other | Source: Ambulatory Visit | Attending: Urology | Admitting: Urology

## 2017-11-19 ENCOUNTER — Other Ambulatory Visit: Payer: Self-pay

## 2017-11-19 ENCOUNTER — Encounter (HOSPITAL_COMMUNITY): Payer: Self-pay

## 2017-11-19 DIAGNOSIS — I10 Essential (primary) hypertension: Secondary | ICD-10-CM | POA: Diagnosis not present

## 2017-11-19 DIAGNOSIS — E119 Type 2 diabetes mellitus without complications: Secondary | ICD-10-CM | POA: Diagnosis not present

## 2017-11-19 DIAGNOSIS — Z01812 Encounter for preprocedural laboratory examination: Secondary | ICD-10-CM | POA: Insufficient documentation

## 2017-11-19 DIAGNOSIS — N329 Bladder disorder, unspecified: Secondary | ICD-10-CM | POA: Insufficient documentation

## 2017-11-19 HISTORY — DX: Acute myocardial infarction, unspecified: I21.9

## 2017-11-19 HISTORY — DX: Other specified disorders of bladder: N32.89

## 2017-11-19 HISTORY — DX: Peripheral vascular disease, unspecified: I73.9

## 2017-11-19 HISTORY — DX: Personal history of other diseases of the respiratory system: Z87.09

## 2017-11-19 LAB — BASIC METABOLIC PANEL
Anion gap: 7 (ref 5–15)
BUN: 22 mg/dL (ref 8–23)
CHLORIDE: 107 mmol/L (ref 98–111)
CO2: 30 mmol/L (ref 22–32)
Calcium: 9.3 mg/dL (ref 8.9–10.3)
Creatinine, Ser: 1.32 mg/dL — ABNORMAL HIGH (ref 0.61–1.24)
GFR calc non Af Amer: 54 mL/min — ABNORMAL LOW (ref >60)
Glucose, Bld: 139 mg/dL — ABNORMAL HIGH (ref 70–99)
POTASSIUM: 5.2 mmol/L — AB (ref 3.5–5.1)
Sodium: 144 mmol/L (ref 135–145)

## 2017-11-19 LAB — CBC
HEMATOCRIT: 31.4 % — AB (ref 39.0–52.0)
HEMOGLOBIN: 10 g/dL — AB (ref 13.0–17.0)
MCH: 28.1 pg (ref 26.0–34.0)
MCHC: 31.8 g/dL (ref 30.0–36.0)
MCV: 88.2 fL (ref 78.0–100.0)
Platelets: 191 10*3/uL (ref 150–400)
RBC: 3.56 MIL/uL — AB (ref 4.22–5.81)
RDW: 14.3 % (ref 11.5–15.5)
WBC: 6.1 10*3/uL (ref 4.0–10.5)

## 2017-11-19 LAB — PROTIME-INR
INR: 1.92
Prothrombin Time: 21.8 seconds — ABNORMAL HIGH (ref 11.4–15.2)

## 2017-11-19 LAB — HEMOGLOBIN A1C
Hgb A1c MFr Bld: 6.8 % — ABNORMAL HIGH (ref 4.8–5.6)
Mean Plasma Glucose: 148.46 mg/dL

## 2017-11-19 LAB — GLUCOSE, CAPILLARY: Glucose-Capillary: 136 mg/dL — ABNORMAL HIGH (ref 70–99)

## 2017-11-19 NOTE — Progress Notes (Signed)
PATIENT AT [facility] REPORTS THAT HE HAS SENT REQUEST TO [facility] TO RELEASE HIS RECORDS TO HIS SURGEON'S OFFICE. RN CALLED AND LVMM FOR PAM GIBSON REQUESTING : LOV CARDIOLOGY , CARDIOLOGY CLEARANCE WITH BLOOD THINNER INSTRUCTIONS, LAST EKG, LABS, STRESS TEST, ECHO , CARDIAC CATH REPORT, LAS DEVICE CHECK, AND CONTACT INFO FOR PROVIDER THAT MANAGES CARDIAC DEVICE SO THAT RN CAN SEND PERIOPERATIVE CARDIAC DEVICE PROGRAM SHEET TO BE FILLED OUT AND RETURNED . RN WILL CONTINUE TO F/U

## 2017-11-22 NOTE — Progress Notes (Addendum)
STILL NO DEVICE ORDERS RECEIVED FROM THE [facility]. RN HAS CALLED MULTIPLE TIMES [phone] EXT [phone] (CARDIAC DEVICE CLINIC) AND EXT [phone] (CARDIOLOGY). RN ALSO SPOKE WITH SWITCHBOARD OPERATOR TO ENSURE THAT RN WAS CALLING THE CORRECT EXTENSION. OPERATOR CONFIRMED THE EXTENSION NUMBERS WERE CORRECT. HOWEVER THE EXT 6942 ONLY RINGS UNTIL THE CALL DISCONNECTS. THE EXT 5540 ONLY HAS VOICEMAIL OPTION AT WHICH RN HAS LEFT MULTIPLE MESSAGES. PATIENT DOES  HAVE CARDIAC CLEARANCE FROM HIS CARDS PROVIDER AT [facility], DR. CHARLES JACOCKS ON CHART. RN WILL HAND OVER PATIENT CHART TO SHORT STAY AND PROVIDE CONTACT INFO FOR THE DEVICE CLINIC .   COPY OF PATIENT'S CARDIAC DEVICE CARD ALSO ON CHART

## 2017-11-22 NOTE — H&P (Signed)
CC/HPI: CC: Bladder mass   Hx: Nathaniel Adams was hospitalized over the weekend for a GI bleed. He had a CT done that showed a 3.8cm enhancing left lateral wall mass consistent with a bladder tumor. The bleeding was felt to be from coumadin which he is now off of but the rectal bleeding has recurred. He just had colonoscopy on 10/20/17 and had 10 polyps removed. One was precancerous. He was also found to have hematuria on a UA last Thursday. He has had some gross hematuria for the last several weeks. He has some urinary frequency, urgency and nocturia but that is chronic. He has no hesitancy or reduced stream. He has a history a stone and he had a ureteroscopy for that in 1998. He has no prior history of UTIs. He has no associated signs or symptoms. He doesn't think he has taken Actos. He also had bilateral pleural effusions and had a Thoracentesis during the hospitalization.     CC: AUA Questions Scoring.  HPI: Nathaniel Adams is a 67 year-old male patient who is here for evaluation.      AUA Symptom Score: Less than 20% of the time he has the sensation of not emptying his bladder completely when finished urinating. 50% of the time he has to urinate again fewer than two hours after he has finished urinating. 50% of the time he has to start and stop again several times when he urinates. 50% of the time he finds it difficult to postpone urination. He never has a weak urinary stream. He never has to push or strain to begin urination. He has to get up to urinate 3 times from the time he goes to bed until the time he gets up in the morning.   Calculated AUA Symptom Score: 13    ALLERGIES: None   MEDICATIONS: Warfarin Sodium 5 mg tablet  Atorvastatin Calcium 80 mg tablet  Carvedilol 25 mg tablet  Fluticasone Propionate 50 mcg/actuation spray, suspension  Furosemide 20 mg tablet  Glucose 4 gram tablet,chewable  Lanoxin 125 mcg tablet  Loratadine 10 mg capsule  Metformin Er Gastric 1,000 mg tablet, er  gastric retention 24 hr  Novolog Flexpen  Precision  Valsartan 80 mg tablet  Vitamin D3 1,000 unit tablet     GU PSH: None     PSH Notes: Defibrillator Implant 2007, 2014, 2018   NON-GU PSH: Cardiac Stent Placement - about 2006 External Defibrillator With Integrated Electrocardiogram Analysis - 2007    GU PMH: None     PMH Notes: Bleeding Disorder  Kidney stones    NON-GU PMH: Acute combined systolic (congestive) and diastolic (congestive) heart failure Anemia, unspecified Atrial Fibrillation Coronary Artery Disease Diabetes Type 2 GERD Glaucoma Hypercholesterolemia Hypertension Myocardial Infarction Sleep Apnea    FAMILY HISTORY: Kidney Cancer - Mother Prostate Cancer - Grandfather   SOCIAL HISTORY: Marital Status: Married Preferred Language: English; Race: White Current Smoking Status: Patient has never smoked.   Tobacco Use Assessment Completed: Used Tobacco in last 30 days? Has never drank.  Does not use drugs. Drinks 2 caffeinated drinks per day. Has not had a blood transfusion.     Notes: Retired Artist.    REVIEW OF SYSTEMS:    GU Review Male:   Patient reports frequent urination, get up at night to urinate, leakage of urine, and stream starts and stops. Patient denies hard to postpone urination, burning/ pain with urination, trouble starting your stream, have to strain to urinate , erection problems, and penile  pain.  Gastrointestinal (Upper):   Patient reports indigestion/ heartburn. Patient denies nausea and vomiting.  Gastrointestinal (Lower):   Patient denies diarrhea and constipation.  Constitutional:   Patient reports fatigue. Patient denies fever, night sweats, and weight loss.  Skin:   Patient denies skin rash/ lesion and itching.  Eyes:   Patient reports blurred vision. Patient denies double vision.  Ears/ Nose/ Throat:   Patient denies sore throat and sinus problems.  Hematologic/Lymphatic:   Patient denies swollen glands and  easy bruising.  Cardiovascular:   No CP or SOB. No edema. Patient denies leg swelling and chest pains.  Respiratory:   Patient denies cough and shortness of breath.  Endocrine:   Patient denies excessive thirst.  Musculoskeletal:   Patient denies back pain and joint pain.  Neurological:   Patient denies headaches and dizziness.  Psychologic:   Patient denies depression and anxiety.   Notes: Blood in urine and in stool, PT gets up 3 times at night    VITAL SIGNS:      11/09/2017 09:42 AM  Weight 224 lb / 101.6 kg  Height 69 in / 175.26 cm  BP 122/74 mmHg  Heart Rate 81 /min  Temperature 97.4 F / 36.3 C  BMI 33.1 kg/m   GU PHYSICAL EXAMINATION:    Anus and Perineum: No hemorrhoids. No anal stenosis. No rectal fissure, no anal fissure. No edema, no dimple, no perineal tenderness, no anal tenderness.  Scrotum: No lesions. No edema. No cysts. No warts.  Epididymides: Right: no spermatocele, no masses, no cysts, no tenderness, no induration, no enlargement. Left: no spermatocele, no masses, no cysts, no tenderness, no induration, no enlargement.  Testes: No tenderness, no swelling, no enlargement left testes. No tenderness, no swelling, no enlargement right testes. Normal location left testes. Normal location right testes. No mass, no cyst, no varicocele, no hydrocele left testes. No mass, no cyst, no varicocele, no hydrocele right testes.  Urethral Meatus: Normal size. No lesion, no wart, no discharge, no polyp. Normal location.  Penis: Penis uncircumcised. mild Balanitis. No foreskin warts, no cracks. No dorsal peyronie's plaques, no left corporal peyronie's plaques, no right corporal peyronie's plaques, no scarring, no shaft warts. No meatal stenosis.   Prostate: Prostate 1 1/2+ size. Left lobe normal consistency, right lobe normal consistency. Symmetrical lobes. No prostate nodule. Left lobe no tenderness, right lobe no tenderness.   Seminal Vesicles: Nonpalpable.  Sphincter Tone: Normal  sphincter. No rectal tenderness. No rectal mass.    MULTI-SYSTEM PHYSICAL EXAMINATION:    Constitutional: Well-nourished. No physical deformities. Normally developed. Good grooming.  Neck: Neck symmetrical, not swollen. Normal tracheal position.  Respiratory: No labored breathing, no use of accessory muscles. Normal breath sounds.  Cardiovascular: Regular rate and rhythm. No murmur, no gallop. Normal temperature, normal extremity pulses, no swelling, no varicosities.   Lymphatic: No enlargement of neck, axillae, groin.  Skin: No paleness, no jaundice, no cyanosis. No lesion, no ulcer, no rash.  Neurologic / Psychiatric: Oriented to time, oriented to place, oriented to person. No depression, no anxiety, no agitation.  Gastrointestinal: Obese abdomen. No hernia. No mass, no tenderness, no rigidity.   Musculoskeletal: Normal gait and station of head and neck.     PAST DATA REVIEWED:  Source Of History:  Patient  Lab Test Review:   CBC with Diff, CMP  Records Review:   Previous Hospital Records  Urine Test Review:   Urinalysis  X-Ray Review: C.T. Abdomen/Pelvis: Reviewed Films. Reviewed Report. Discussed With Patient. He  has a 3.8cm enhancing mass on the left bladder wall consistent with UCCa.    Notes:                     I have reviewed his hospital records. He is anemic with a Hgb of 9.8 on 7/7. Cr is 1.39 and his INR on 7/7 was 1.66.    PROCEDURES:          Urinalysis w/Scope Dipstick Dipstick Cont'd Micro  Color: Red Bilirubin: Neg WBC/hpf: NS (Not Seen)  Appearance: Turbid Ketones: Neg RBC/hpf: >60/hpf  Specific Gravity: 1.020 Blood: 3+ Bacteria: NS (Not Seen)  pH: 6.0 Protein: 2+ Cystals: NS (Not Seen)  Glucose: Neg Urobilinogen: 0.2 Casts: NS (Not Seen)    Nitrites: Neg Trichomonas: Not Present    Leukocyte Esterase: Trace Mucous: Not Present      Epithelial Cells: NS (Not Seen)      Yeast: NS (Not Seen)      Sperm: Not Present    Notes: Microscopic done on unconcentrated  urine    ASSESSMENT:      ICD-10 Details  1 GU:   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 I am going to get him set up for cystoscopy with TURBT, possible epirubicin, possible rtg and stent. I reviewed the risks of bleeding, infection, bladder wall injury, ureteral injury, possible need for a stent and secondary procedures, strictures, chemical cystitis, thrombotic events and anesthetic complications. I didn't feel that he needed cysto today since the diagnosis is fairly clear. He will need cardiac clearance.   2   Gross hematuria - R31.0    PLAN:           Orders Labs Urine Cytology          Schedule Return Visit/Planned Activity: Next Available Appointment - Schedule Surgery             Note: needs TURBT in 1-2 weeks.

## 2017-11-22 NOTE — Progress Notes (Signed)
ICD ORDER SHEET RECEIVED FROM [place], [place]. ICD ORDER SHEET HANDED OFF TO SHORT STAY NURSE CHRISTIE. CHRISTIE TO PLACE ON PATIENTS CHART FOR SURGERY TOMORROW.

## 2017-11-22 NOTE — H&P (View-Only) (Signed)
CC/HPI: CC: Bladder mass   Hx: Nathaniel Adams was hospitalized over the weekend for a GI bleed. He had a CT done that showed a 3.8cm enhancing left lateral wall mass consistent with a bladder tumor. The bleeding was felt to be from coumadin which he is now off of but the rectal bleeding has recurred. He just had colonoscopy on 10/20/17 and had 10 polyps removed. One was precancerous. He was also found to have hematuria on a UA last Thursday. He has had some gross hematuria for the last several weeks. He has some urinary frequency, urgency and nocturia but that is chronic. He has no hesitancy or reduced stream. He has a history a stone and he had a ureteroscopy for that in 1998. He has no prior history of UTIs. He has no associated signs or symptoms. He doesn't think he has taken Actos. He also had bilateral pleural effusions and had a Thoracentesis during the hospitalization.     CC: AUA Questions Scoring.  HPI: Nathaniel Adams is a 67 year-old male patient who is here for evaluation.      AUA Symptom Score: Less than 20% of the time he has the sensation of not emptying his bladder completely when finished urinating. 50% of the time he has to urinate again fewer than two hours after he has finished urinating. 50% of the time he has to start and stop again several times when he urinates. 50% of the time he finds it difficult to postpone urination. He never has a weak urinary stream. He never has to push or strain to begin urination. He has to get up to urinate 3 times from the time he goes to bed until the time he gets up in the morning.   Calculated AUA Symptom Score: 13    ALLERGIES: None   MEDICATIONS: Warfarin Sodium 5 mg tablet  Atorvastatin Calcium 80 mg tablet  Carvedilol 25 mg tablet  Fluticasone Propionate 50 mcg/actuation spray, suspension  Furosemide 20 mg tablet  Glucose 4 gram tablet,chewable  Lanoxin 125 mcg tablet  Loratadine 10 mg capsule  Metformin Er Gastric 1,000 mg tablet, er  gastric retention 24 hr  Novolog Flexpen  Precision  Valsartan 80 mg tablet  Vitamin D3 1,000 unit tablet     GU PSH: None     PSH Notes: Defibrillator Implant 2007, 2014, 2018   NON-GU PSH: Cardiac Stent Placement - about 2006 External Defibrillator With Integrated Electrocardiogram Analysis - 2007    GU PMH: None     PMH Notes: Bleeding Disorder  Kidney stones    NON-GU PMH: Acute combined systolic (congestive) and diastolic (congestive) heart failure Anemia, unspecified Atrial Fibrillation Coronary Artery Disease Diabetes Type 2 GERD Glaucoma Hypercholesterolemia Hypertension Myocardial Infarction Sleep Apnea    FAMILY HISTORY: Kidney Cancer - Mother Prostate Cancer - Grandfather   SOCIAL HISTORY: Marital Status: Married Preferred Language: English; Race: White Current Smoking Status: Patient has never smoked.   Tobacco Use Assessment Completed: Used Tobacco in last 30 days? Has never drank.  Does not use drugs. Drinks 2 caffeinated drinks per day. Has not had a blood transfusion.     Notes: Retired Artist.    REVIEW OF SYSTEMS:    GU Review Male:   Patient reports frequent urination, get up at night to urinate, leakage of urine, and stream starts and stops. Patient denies hard to postpone urination, burning/ pain with urination, trouble starting your stream, have to strain to urinate , erection problems, and penile  pain.  Gastrointestinal (Upper):   Patient reports indigestion/ heartburn. Patient denies nausea and vomiting.  Gastrointestinal (Lower):   Patient denies diarrhea and constipation.  Constitutional:   Patient reports fatigue. Patient denies fever, night sweats, and weight loss.  Skin:   Patient denies skin rash/ lesion and itching.  Eyes:   Patient reports blurred vision. Patient denies double vision.  Ears/ Nose/ Throat:   Patient denies sore throat and sinus problems.  Hematologic/Lymphatic:   Patient denies swollen glands and  easy bruising.  Cardiovascular:   No CP or SOB. No edema. Patient denies leg swelling and chest pains.  Respiratory:   Patient denies cough and shortness of breath.  Endocrine:   Patient denies excessive thirst.  Musculoskeletal:   Patient denies back pain and joint pain.  Neurological:   Patient denies headaches and dizziness.  Psychologic:   Patient denies depression and anxiety.   Notes: Blood in urine and in stool, PT gets up 3 times at night    VITAL SIGNS:      11/09/2017 09:42 AM  Weight 224 lb / 101.6 kg  Height 69 in / 175.26 cm  BP 122/74 mmHg  Heart Rate 81 /min  Temperature 97.4 F / 36.3 C  BMI 33.1 kg/m   GU PHYSICAL EXAMINATION:    Anus and Perineum: No hemorrhoids. No anal stenosis. No rectal fissure, no anal fissure. No edema, no dimple, no perineal tenderness, no anal tenderness.  Scrotum: No lesions. No edema. No cysts. No warts.  Epididymides: Right: no spermatocele, no masses, no cysts, no tenderness, no induration, no enlargement. Left: no spermatocele, no masses, no cysts, no tenderness, no induration, no enlargement.  Testes: No tenderness, no swelling, no enlargement left testes. No tenderness, no swelling, no enlargement right testes. Normal location left testes. Normal location right testes. No mass, no cyst, no varicocele, no hydrocele left testes. No mass, no cyst, no varicocele, no hydrocele right testes.  Urethral Meatus: Normal size. No lesion, no wart, no discharge, no polyp. Normal location.  Penis: Penis uncircumcised. mild Balanitis. No foreskin warts, no cracks. No dorsal peyronie's plaques, no left corporal peyronie's plaques, no right corporal peyronie's plaques, no scarring, no shaft warts. No meatal stenosis.   Prostate: Prostate 1 1/2+ size. Left lobe normal consistency, right lobe normal consistency. Symmetrical lobes. No prostate nodule. Left lobe no tenderness, right lobe no tenderness.   Seminal Vesicles: Nonpalpable.  Sphincter Tone: Normal  sphincter. No rectal tenderness. No rectal mass.    MULTI-SYSTEM PHYSICAL EXAMINATION:    Constitutional: Well-nourished. No physical deformities. Normally developed. Good grooming.  Neck: Neck symmetrical, not swollen. Normal tracheal position.  Respiratory: No labored breathing, no use of accessory muscles. Normal breath sounds.  Cardiovascular: Regular rate and rhythm. No murmur, no gallop. Normal temperature, normal extremity pulses, no swelling, no varicosities.   Lymphatic: No enlargement of neck, axillae, groin.  Skin: No paleness, no jaundice, no cyanosis. No lesion, no ulcer, no rash.  Neurologic / Psychiatric: Oriented to time, oriented to place, oriented to person. No depression, no anxiety, no agitation.  Gastrointestinal: Obese abdomen. No hernia. No mass, no tenderness, no rigidity.   Musculoskeletal: Normal gait and station of head and neck.     PAST DATA REVIEWED:  Source Of History:  Patient  Lab Test Review:   CBC with Diff, CMP  Records Review:   Previous Hospital Records  Urine Test Review:   Urinalysis  X-Ray Review: C.T. Abdomen/Pelvis: Reviewed Films. Reviewed Report. Discussed With Patient. He  has a 3.8cm enhancing mass on the left bladder wall consistent with UCCa.    Notes:                     I have reviewed his hospital records. He is anemic with a Hgb of 9.8 on 7/7. Cr is 1.39 and his INR on 7/7 was 1.66.    PROCEDURES:          Urinalysis w/Scope Dipstick Dipstick Cont'd Micro  Color: Red Bilirubin: Neg WBC/hpf: NS (Not Seen)  Appearance: Turbid Ketones: Neg RBC/hpf: >60/hpf  Specific Gravity: 1.020 Blood: 3+ Bacteria: NS (Not Seen)  pH: 6.0 Protein: 2+ Cystals: NS (Not Seen)  Glucose: Neg Urobilinogen: 0.2 Casts: NS (Not Seen)    Nitrites: Neg Trichomonas: Not Present    Leukocyte Esterase: Trace Mucous: Not Present      Epithelial Cells: NS (Not Seen)      Yeast: NS (Not Seen)      Sperm: Not Present    Notes: Microscopic done on unconcentrated  urine    ASSESSMENT:      ICD-10 Details  1 GU:   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 I am going to get him set up for cystoscopy with TURBT, possible epirubicin, possible rtg and stent. I reviewed the risks of bleeding, infection, bladder wall injury, ureteral injury, possible need for a stent and secondary procedures, strictures, chemical cystitis, thrombotic events and anesthetic complications. I didn't feel that he needed cysto today since the diagnosis is fairly clear. He will need cardiac clearance.   2   Gross hematuria - R31.0    PLAN:           Orders Labs Urine Cytology          Schedule Return Visit/Planned Activity: Next Available Appointment - Schedule Surgery             Note: needs TURBT in 1-2 weeks.

## 2017-11-22 NOTE — Progress Notes (Signed)
ON FRIDAY 01-20-18 RN SPOKE WITH TIFFANY CHASTEN AT [place]. RN MADE TIFFANY AWARE THAT PHYSICIAN NEEDS TO SIGN OFF ON THE PERI-OP CARDIAC DEVICE SHEET FOR UPCOMING SURGERY. TIFFANY REPORTS THAT [place] IS WHO IS THE OVERALL MANAGING MR. Perkovich DEFIBRILLATOR. TIFFANY PROVIDED CLINIC FAX NUMBER [phone] AND INSTRUCTED RN TO SEND THE FORM AND THAT IT WOULD BE GIVEN TO [place] TO SIGN OFF. RN DID SO.   NO FORM HAS BEEN RECEIVED SINCE THAT DIALOGUE. RN CALLED CLINIC AGAIN TODAY AND LVMM REQUESTING THE FORM BE COMPLETED AND RETURNED WITH URGENCY AS SURGERY IS TOMORROW. RN LEFT DIRECT LINE  CALL BACK NUMBER AND RN REFAXED FORM TO AFOREMENTIONED FAX NUMBER. RN TO CONTINUE TO F/U

## 2017-11-22 NOTE — Progress Notes (Signed)
LAST EKG REQUESTED Nathaniel Adams

## 2017-11-22 NOTE — Progress Notes (Signed)
[facility]bc routed via epic to dr Irine Seal

## 2017-11-23 ENCOUNTER — Encounter (HOSPITAL_COMMUNITY): Payer: Self-pay | Admitting: *Deleted

## 2017-11-23 ENCOUNTER — Inpatient Hospital Stay (HOSPITAL_COMMUNITY)
Admission: RE | Admit: 2017-11-23 | Discharge: 2017-11-26 | DRG: 988 | Disposition: A | Discharge status: Home / Self Care | Payer: Medicare Other | Attending: Urology | Admitting: Urology

## 2017-11-23 ENCOUNTER — Ambulatory Visit (HOSPITAL_COMMUNITY): Payer: Medicare Other

## 2017-11-23 ENCOUNTER — Ambulatory Visit (HOSPITAL_COMMUNITY): Payer: Medicare Other | Admitting: Certified Registered Nurse Anesthetist

## 2017-11-23 ENCOUNTER — Encounter (HOSPITAL_COMMUNITY)
Admission: RE | Disposition: A | Discharge status: Home / Self Care | Payer: Self-pay | Source: Home / Self Care | Attending: Urology

## 2017-11-23 ENCOUNTER — Other Ambulatory Visit: Payer: Self-pay

## 2017-11-23 DIAGNOSIS — R310 Gross hematuria: Secondary | ICD-10-CM | POA: Diagnosis present

## 2017-11-23 DIAGNOSIS — Z8051 Family history of malignant neoplasm of kidney: Secondary | ICD-10-CM

## 2017-11-23 DIAGNOSIS — I4891 Unspecified atrial fibrillation: Secondary | ICD-10-CM | POA: Diagnosis present

## 2017-11-23 DIAGNOSIS — I130 Hypertensive heart and chronic kidney disease with heart failure and stage 1 through stage 4 chronic kidney disease, or unspecified chronic kidney disease: Secondary | ICD-10-CM | POA: Diagnosis present

## 2017-11-23 DIAGNOSIS — I700 Atherosclerosis of aorta: Secondary | ICD-10-CM | POA: Diagnosis present

## 2017-11-23 DIAGNOSIS — E875 Hyperkalemia: Principal | ICD-10-CM | POA: Diagnosis present

## 2017-11-23 DIAGNOSIS — E1122 Type 2 diabetes mellitus with diabetic chronic kidney disease: Secondary | ICD-10-CM | POA: Diagnosis present

## 2017-11-23 DIAGNOSIS — Z955 Presence of coronary angioplasty implant and graft: Secondary | ICD-10-CM

## 2017-11-23 DIAGNOSIS — K769 Liver disease, unspecified: Secondary | ICD-10-CM | POA: Diagnosis present

## 2017-11-23 DIAGNOSIS — I10 Essential (primary) hypertension: Secondary | ICD-10-CM | POA: Diagnosis present

## 2017-11-23 DIAGNOSIS — Z95810 Presence of automatic (implantable) cardiac defibrillator: Secondary | ICD-10-CM

## 2017-11-23 DIAGNOSIS — Z79899 Other long term (current) drug therapy: Secondary | ICD-10-CM

## 2017-11-23 DIAGNOSIS — C672 Malignant neoplasm of lateral wall of bladder: Secondary | ICD-10-CM

## 2017-11-23 DIAGNOSIS — E119 Type 2 diabetes mellitus without complications: Secondary | ICD-10-CM

## 2017-11-23 DIAGNOSIS — I252 Old myocardial infarction: Secondary | ICD-10-CM

## 2017-11-23 DIAGNOSIS — N183 Chronic kidney disease, stage 3 (moderate): Secondary | ICD-10-CM | POA: Diagnosis present

## 2017-11-23 DIAGNOSIS — I509 Heart failure, unspecified: Secondary | ICD-10-CM | POA: Diagnosis present

## 2017-11-23 DIAGNOSIS — Z888 Allergy status to other drugs, medicaments and biological substances status: Secondary | ICD-10-CM

## 2017-11-23 DIAGNOSIS — R351 Nocturia: Secondary | ICD-10-CM | POA: Diagnosis present

## 2017-11-23 DIAGNOSIS — R160 Hepatomegaly, not elsewhere classified: Secondary | ICD-10-CM | POA: Diagnosis present

## 2017-11-23 DIAGNOSIS — E871 Hypo-osmolality and hyponatremia: Secondary | ICD-10-CM | POA: Diagnosis present

## 2017-11-23 DIAGNOSIS — K219 Gastro-esophageal reflux disease without esophagitis: Secondary | ICD-10-CM | POA: Diagnosis present

## 2017-11-23 DIAGNOSIS — Z79891 Long term (current) use of opiate analgesic: Secondary | ICD-10-CM

## 2017-11-23 DIAGNOSIS — Z87442 Personal history of urinary calculi: Secondary | ICD-10-CM

## 2017-11-23 DIAGNOSIS — J449 Chronic obstructive pulmonary disease, unspecified: Secondary | ICD-10-CM | POA: Diagnosis present

## 2017-11-23 DIAGNOSIS — N179 Acute kidney failure, unspecified: Secondary | ICD-10-CM | POA: Diagnosis present

## 2017-11-23 DIAGNOSIS — E1151 Type 2 diabetes mellitus with diabetic peripheral angiopathy without gangrene: Secondary | ICD-10-CM | POA: Diagnosis present

## 2017-11-23 DIAGNOSIS — Z7901 Long term (current) use of anticoagulants: Secondary | ICD-10-CM

## 2017-11-23 DIAGNOSIS — H409 Unspecified glaucoma: Secondary | ICD-10-CM | POA: Diagnosis present

## 2017-11-23 DIAGNOSIS — G4733 Obstructive sleep apnea (adult) (pediatric): Secondary | ICD-10-CM | POA: Diagnosis present

## 2017-11-23 DIAGNOSIS — K5730 Diverticulosis of large intestine without perforation or abscess without bleeding: Secondary | ICD-10-CM | POA: Diagnosis present

## 2017-11-23 DIAGNOSIS — R34 Anuria and oliguria: Secondary | ICD-10-CM | POA: Diagnosis present

## 2017-11-23 DIAGNOSIS — E7800 Pure hypercholesterolemia, unspecified: Secondary | ICD-10-CM | POA: Diagnosis present

## 2017-11-23 DIAGNOSIS — Y838 Other surgical procedures as the cause of abnormal reaction of the patient, or of later complication, without mention of misadventure at the time of the procedure: Secondary | ICD-10-CM | POA: Diagnosis not present

## 2017-11-23 DIAGNOSIS — I2510 Atherosclerotic heart disease of native coronary artery without angina pectoris: Secondary | ICD-10-CM | POA: Diagnosis present

## 2017-11-23 DIAGNOSIS — Z794 Long term (current) use of insulin: Secondary | ICD-10-CM

## 2017-11-23 DIAGNOSIS — N99820 Postprocedural hemorrhage and hematoma of a genitourinary system organ or structure following a genitourinary system procedure: Secondary | ICD-10-CM | POA: Diagnosis not present

## 2017-11-23 DIAGNOSIS — Z8042 Family history of malignant neoplasm of prostate: Secondary | ICD-10-CM

## 2017-11-23 DIAGNOSIS — J90 Pleural effusion, not elsewhere classified: Secondary | ICD-10-CM

## 2017-11-23 DIAGNOSIS — C679 Malignant neoplasm of bladder, unspecified: Secondary | ICD-10-CM | POA: Diagnosis present

## 2017-11-23 DIAGNOSIS — D631 Anemia in chronic kidney disease: Secondary | ICD-10-CM | POA: Diagnosis present

## 2017-11-23 HISTORY — PX: CYSTOSCOPY W/ URETERAL STENT PLACEMENT: SHX1429

## 2017-11-23 HISTORY — PX: TRANSURETHRAL RESECTION OF BLADDER TUMOR WITH MITOMYCIN-C: SHX6459

## 2017-11-23 LAB — GLUCOSE, CAPILLARY
GLUCOSE-CAPILLARY: 128 mg/dL — AB (ref 70–99)
Glucose-Capillary: 144 mg/dL — ABNORMAL HIGH (ref 70–99)
Glucose-Capillary: 299 mg/dL — ABNORMAL HIGH (ref 70–99)
Glucose-Capillary: 283 mg/dL — ABNORMAL HIGH (ref 70–99)

## 2017-11-23 LAB — PROTIME-INR
INR: 1.25
Prothrombin Time: 15.6 seconds — ABNORMAL HIGH (ref 11.4–15.2)

## 2017-11-23 SURGERY — TRANSURETHRAL RESECTION OF BLADDER TUMOR WITH MITOMYCIN-C
Anesthesia: General

## 2017-11-23 MED ORDER — BELLADONNA ALKALOIDS-OPIUM 16.2-60 MG RE SUPP
1.0000 | Freq: Every day | RECTAL | Status: DC
Start: 2017-11-23 — End: 2017-11-23
  Administered 2017-11-23: 1 via RECTAL

## 2017-11-23 MED ORDER — PROPOFOL 10 MG/ML IV BOLUS
INTRAVENOUS | Status: DC | PRN
  Administered 2017-11-23: 100 mg via INTRAVENOUS

## 2017-11-23 MED ORDER — SENNOSIDES-DOCUSATE SODIUM 8.6-50 MG PO TABS: 1.0000 | ORAL_TABLET | Freq: Every evening | ORAL | Status: DC | PRN

## 2017-11-23 MED ORDER — ATORVASTATIN CALCIUM 40 MG PO TABS
40.0000 mg | ORAL_TABLET | Freq: Every day | ORAL | Status: DC
  Administered 2017-11-23 – 2017-11-25 (×3): 40 mg via ORAL
  Filled 2017-11-23 (×3): qty 1

## 2017-11-23 MED ORDER — ACETAMINOPHEN 650 MG RE SUPP: 650.0000 mg | RECTAL | Status: DC | PRN

## 2017-11-23 MED ORDER — ONDANSETRON HCL 4 MG/2ML IJ SOLN
INTRAMUSCULAR | Status: DC | PRN
  Administered 2017-11-23: 4 mg via INTRAVENOUS

## 2017-11-23 MED ORDER — BISACODYL 10 MG RE SUPP
10.0000 mg | Freq: Every day | RECTAL | Status: DC | PRN
Start: 2017-11-23 — End: 2017-11-26

## 2017-11-23 MED ORDER — GEMCITABINE CHEMO FOR BLADDER INSTILLATION 2000 MG
2000.0000 mg | Freq: Once | INTRAVENOUS | Status: AC
  Administered 2017-11-23: 2000 mg via INTRAVESICAL
  Filled 2017-11-23: qty 2000

## 2017-11-23 MED ORDER — DEXAMETHASONE SODIUM PHOSPHATE 4 MG/ML IJ SOLN
INTRAMUSCULAR | Status: DC | PRN
  Administered 2017-11-23: 4 mg via INTRAVENOUS

## 2017-11-23 MED ORDER — FENTANYL CITRATE (PF) 100 MCG/2ML IJ SOLN
INTRAMUSCULAR | Status: DC | PRN
  Administered 2017-11-23 (×2): 50 ug via INTRAVENOUS

## 2017-11-23 MED ORDER — HYDROCODONE-ACETAMINOPHEN 5-325 MG PO TABS
1.0000 | ORAL_TABLET | ORAL | Status: DC | PRN
  Administered 2017-11-23: 1 via ORAL
  Filled 2017-11-23 (×2): qty 1

## 2017-11-23 MED ORDER — SODIUM CHLORIDE 0.9% FLUSH: 3.0000 mL | INTRAVENOUS | Status: DC | PRN

## 2017-11-23 MED ORDER — BELLADONNA ALKALOIDS-OPIUM 16.2-60 MG RE SUPP
1.0000 | Freq: Four times a day (QID) | RECTAL | Status: DC | PRN
  Administered 2017-11-24: 1 via RECTAL
  Filled 2017-11-23: qty 1

## 2017-11-23 MED ORDER — FENTANYL CITRATE (PF) 100 MCG/2ML IJ SOLN
INTRAMUSCULAR | Status: AC
  Administered 2017-11-23: 50 ug via INTRAVENOUS
  Filled 2017-11-23: qty 4

## 2017-11-23 MED ORDER — LIDOCAINE 2% (20 MG/ML) 5 ML SYRINGE
INTRAMUSCULAR | Status: AC
  Filled 2017-11-23: qty 5

## 2017-11-23 MED ORDER — SUGAMMADEX SODIUM 200 MG/2ML IV SOLN
INTRAVENOUS | Status: AC
  Filled 2017-11-23: qty 2

## 2017-11-23 MED ORDER — MIDAZOLAM HCL 5 MG/5ML IJ SOLN
INTRAMUSCULAR | Status: DC | PRN
  Administered 2017-11-23 (×2): 1 mg via INTRAVENOUS

## 2017-11-23 MED ORDER — CARVEDILOL 25 MG PO TABS
37.5000 mg | ORAL_TABLET | Freq: Two times a day (BID) | ORAL | Status: DC
  Administered 2017-11-23 – 2017-11-26 (×6): 37.5 mg via ORAL
  Filled 2017-11-23 (×6): qty 1

## 2017-11-23 MED ORDER — FENTANYL CITRATE (PF) 100 MCG/2ML IJ SOLN
25.0000 ug | INTRAMUSCULAR | Status: DC | PRN
  Administered 2017-11-23: 25 ug via INTRAVENOUS
  Administered 2017-11-23 (×2): 50 ug via INTRAVENOUS

## 2017-11-23 MED ORDER — ROCURONIUM BROMIDE 10 MG/ML (PF) SYRINGE
PREFILLED_SYRINGE | INTRAVENOUS | Status: AC
  Filled 2017-11-23: qty 10

## 2017-11-23 MED ORDER — DIPHENHYDRAMINE HCL 50 MG/ML IJ SOLN: 12.5000 mg | Freq: Four times a day (QID) | INTRAMUSCULAR | Status: DC | PRN

## 2017-11-23 MED ORDER — SODIUM CHLORIDE 0.9 % IV SOLN: 250.0000 mL | INTRAVENOUS | Status: DC | PRN

## 2017-11-23 MED ORDER — KETOROLAC TROMETHAMINE 30 MG/ML IJ SOLN
INTRAMUSCULAR | Status: AC
  Filled 2017-11-23: qty 1

## 2017-11-23 MED ORDER — CEFAZOLIN SODIUM-DEXTROSE 2-4 GM/100ML-% IV SOLN
INTRAVENOUS | Status: AC
  Filled 2017-11-23: qty 100

## 2017-11-23 MED ORDER — PHENYLEPHRINE 40 MCG/ML (10ML) SYRINGE FOR IV PUSH (FOR BLOOD PRESSURE SUPPORT)
PREFILLED_SYRINGE | INTRAVENOUS | Status: AC
  Filled 2017-11-23: qty 10

## 2017-11-23 MED ORDER — MIDAZOLAM HCL 2 MG/2ML IJ SOLN
INTRAMUSCULAR | Status: AC
  Filled 2017-11-23: qty 2

## 2017-11-23 MED ORDER — METFORMIN HCL 500 MG PO TABS: 1000.0000 mg | ORAL_TABLET | Freq: Two times a day (BID) | ORAL | Status: DC

## 2017-11-23 MED ORDER — DIGOXIN 125 MCG PO TABS: 0.1250 mg | ORAL_TABLET | Freq: Every day | ORAL | Status: DC

## 2017-11-23 MED ORDER — INSULIN ASPART 100 UNIT/ML ~~LOC~~ SOLN
0.0000 [IU] | Freq: Three times a day (TID) | SUBCUTANEOUS | Status: DC
  Administered 2017-11-23: 8 [IU] via SUBCUTANEOUS
  Administered 2017-11-24: 5 [IU] via SUBCUTANEOUS
  Administered 2017-11-24: 8 [IU] via SUBCUTANEOUS
  Administered 2017-11-24: 3 [IU] via SUBCUTANEOUS
  Administered 2017-11-25: 2 [IU] via SUBCUTANEOUS
  Administered 2017-11-25: 3 [IU] via SUBCUTANEOUS
  Administered 2017-11-25: 5 [IU] via SUBCUTANEOUS
  Administered 2017-11-26 (×2): 3 [IU] via SUBCUTANEOUS

## 2017-11-23 MED ORDER — FENTANYL CITRATE (PF) 100 MCG/2ML IJ SOLN
INTRAMUSCULAR | Status: AC
  Filled 2017-11-23: qty 2

## 2017-11-23 MED ORDER — OXYCODONE HCL 5 MG PO TABS: 5.0000 mg | ORAL_TABLET | ORAL | Status: DC | PRN

## 2017-11-23 MED ORDER — ONDANSETRON HCL 4 MG/2ML IJ SOLN
INTRAMUSCULAR | Status: AC
  Filled 2017-11-23: qty 2

## 2017-11-23 MED ORDER — ACETAMINOPHEN 325 MG PO TABS: 650.0000 mg | ORAL_TABLET | ORAL | Status: DC | PRN

## 2017-11-23 MED ORDER — PROMETHAZINE HCL 25 MG/ML IJ SOLN: 6.2500 mg | INTRAMUSCULAR | Status: DC | PRN

## 2017-11-23 MED ORDER — SODIUM CHLORIDE 0.45 % IV SOLN
INTRAVENOUS | Status: DC
  Administered 2017-11-23 – 2017-11-24 (×2): via INTRAVENOUS

## 2017-11-23 MED ORDER — PHENYLEPHRINE 40 MCG/ML (10ML) SYRINGE FOR IV PUSH (FOR BLOOD PRESSURE SUPPORT)
PREFILLED_SYRINGE | INTRAVENOUS | Status: DC | PRN
  Administered 2017-11-23: 80 ug via INTRAVENOUS

## 2017-11-23 MED ORDER — SUGAMMADEX SODIUM 200 MG/2ML IV SOLN
INTRAVENOUS | Status: DC | PRN
  Administered 2017-11-23: 400 mg via INTRAVENOUS

## 2017-11-23 MED ORDER — ONDANSETRON HCL 4 MG/2ML IJ SOLN: 4.0000 mg | INTRAMUSCULAR | Status: DC | PRN

## 2017-11-23 MED ORDER — LIDOCAINE 2% (20 MG/ML) 5 ML SYRINGE
INTRAMUSCULAR | Status: DC | PRN
  Administered 2017-11-23: 60 mg via INTRAVENOUS

## 2017-11-23 MED ORDER — SODIUM CHLORIDE 0.9 % IV BOLUS
250.0000 mL | Freq: Once | INTRAVENOUS | Status: AC
  Administered 2017-11-23: 250 mL via INTRAVENOUS

## 2017-11-23 MED ORDER — LACTATED RINGERS IV SOLN
INTRAVENOUS | Status: DC
  Administered 2017-11-23: 07:00:00 via INTRAVENOUS

## 2017-11-23 MED ORDER — SPIRONOLACTONE 25 MG PO TABS
25.0000 mg | ORAL_TABLET | Freq: Every day | ORAL | Status: DC
  Administered 2017-11-23: 25 mg via ORAL
  Filled 2017-11-23: qty 1

## 2017-11-23 MED ORDER — MORPHINE SULFATE (PF) 2 MG/ML IV SOLN
2.0000 mg | INTRAVENOUS | Status: DC | PRN
  Administered 2017-11-23 (×2): 2 mg via INTRAVENOUS
  Filled 2017-11-23 (×3): qty 1

## 2017-11-23 MED ORDER — IRBESARTAN 150 MG PO TABS
75.0000 mg | ORAL_TABLET | Freq: Every day | ORAL | Status: DC
  Administered 2017-11-23: 75 mg via ORAL
  Filled 2017-11-23: qty 1

## 2017-11-23 MED ORDER — LORATADINE 10 MG PO TABS
10.0000 mg | ORAL_TABLET | Freq: Every day | ORAL | Status: DC
  Administered 2017-11-24 – 2017-11-26 (×3): 10 mg via ORAL
  Filled 2017-11-23 (×3): qty 1

## 2017-11-23 MED ORDER — ZOLPIDEM TARTRATE 5 MG PO TABS: 5.0000 mg | ORAL_TABLET | Freq: Every evening | ORAL | Status: DC | PRN

## 2017-11-23 MED ORDER — DEXAMETHASONE SODIUM PHOSPHATE 10 MG/ML IJ SOLN
INTRAMUSCULAR | Status: AC
  Filled 2017-11-23: qty 1

## 2017-11-23 MED ORDER — TRAMADOL HCL 50 MG PO TABS: 50.0000 mg | ORAL_TABLET | Freq: Four times a day (QID) | ORAL | 0 refills | Status: AC | PRN

## 2017-11-23 MED ORDER — FLUTICASONE PROPIONATE 50 MCG/ACT NA SUSP
2.0000 | Freq: Every day | NASAL | Status: DC
  Administered 2017-11-24 – 2017-11-26 (×3): 2 via NASAL
  Filled 2017-11-23: qty 16

## 2017-11-23 MED ORDER — BELLADONNA ALKALOIDS-OPIUM 16.2-60 MG RE SUPP
RECTAL | Status: AC
  Filled 2017-11-23: qty 1

## 2017-11-23 MED ORDER — FLEET ENEMA 7-19 GM/118ML RE ENEM: 1.0000 | ENEMA | Freq: Once | RECTAL | Status: DC | PRN

## 2017-11-23 MED ORDER — CEFAZOLIN SODIUM-DEXTROSE 2-4 GM/100ML-% IV SOLN
2.0000 g | INTRAVENOUS | Status: AC
  Administered 2017-11-23: 2 g via INTRAVENOUS

## 2017-11-23 MED ORDER — SODIUM CHLORIDE 0.9% FLUSH
3.0000 mL | Freq: Two times a day (BID) | INTRAVENOUS | Status: DC
  Administered 2017-11-23 – 2017-11-25 (×4): 3 mL via INTRAVENOUS

## 2017-11-23 MED ORDER — SODIUM CHLORIDE 0.9 % IR SOLN
Status: DC | PRN
  Administered 2017-11-23: 15000 mL via INTRAVESICAL

## 2017-11-23 MED ORDER — IOHEXOL 300 MG/ML  SOLN
INTRAMUSCULAR | Status: DC | PRN
  Administered 2017-11-23: 9 mL via URETHRAL

## 2017-11-23 MED ORDER — DIPHENHYDRAMINE HCL 12.5 MG/5ML PO ELIX: 12.5000 mg | ORAL_SOLUTION | Freq: Four times a day (QID) | ORAL | Status: DC | PRN

## 2017-11-23 MED ORDER — MORPHINE SULFATE (PF) 2 MG/ML IV SOLN: 1.0000 mg | INTRAVENOUS | Status: DC | PRN

## 2017-11-23 MED ORDER — FUROSEMIDE 20 MG PO TABS: 20.0000 mg | ORAL_TABLET | Freq: Every day | ORAL | Status: DC

## 2017-11-23 MED ORDER — ROCURONIUM BROMIDE 10 MG/ML (PF) SYRINGE
PREFILLED_SYRINGE | INTRAVENOUS | Status: DC | PRN
  Administered 2017-11-23: 10 mg via INTRAVENOUS
  Administered 2017-11-23: 50 mg via INTRAVENOUS

## 2017-11-23 SURGICAL SUPPLY — 19 items
BAG URINE DRAINAGE (UROLOGICAL SUPPLIES) ×4 IMPLANT
BAG URO CATCHER STRL LF (MISCELLANEOUS) ×4 IMPLANT
CATH FOLEY 2WAY SLVR  5CC 22FR (CATHETERS) ×2
CATH FOLEY 2WAY SLVR 5CC 22FR (CATHETERS) ×2 IMPLANT
CATH URET 5FR 28IN OPEN ENDED (CATHETERS) ×4 IMPLANT
CLOTH BEACON ORANGE TIMEOUT ST (SAFETY) ×4 IMPLANT
COVER FOOTSWITCH UNIV (MISCELLANEOUS) IMPLANT
COVER SURGICAL LIGHT HANDLE (MISCELLANEOUS) IMPLANT
GLOVE SURG SS PI 8.0 STRL IVOR (GLOVE) ×4 IMPLANT
GOWN STRL REUS W/TWL XL LVL3 (GOWN DISPOSABLE) ×4 IMPLANT
GUIDEWIRE STR DUAL SENSOR (WIRE) ×4 IMPLANT
HOLDER FOLEY CATH W/STRAP (MISCELLANEOUS) ×4 IMPLANT
LOOP CUT BIPOLAR 24F LRG (ELECTROSURGICAL) ×4 IMPLANT
MANIFOLD NEPTUNE II (INSTRUMENTS) ×4 IMPLANT
PACK CYSTO (CUSTOM PROCEDURE TRAY) ×4 IMPLANT
SYRINGE IRR TOOMEY STRL 70CC (SYRINGE) ×4 IMPLANT
TUBING CONNECTING 10 (TUBING) ×3 IMPLANT
TUBING CONNECTING 10' (TUBING) ×1
WATER STERILE IRR 500ML POUR (IV SOLUTION) ×4 IMPLANT

## 2017-11-23 NOTE — Op Note (Signed)
Procedure: 1.  Cystoscopy with left retrograde pyelogram and interpretation. 2.  Transurethral resection of large bladder tumor of the left trigone and lateral wall. 3.  Instillation of intravesical gemcitabine in PACU.  Preop diagnosis: Left bladder tumor adjacent to trigone.  Postop diagnosis: Large left bladder tumor adjacent to left ureteral orifice extending onto the left lateral wall.  Surgeon: Dr. Irine Seal.  Anesthesia: General.  Specimen: 1.  Tumor chips. 2.  Base of the tumor chips.  Drain: 67 [place] Foley catheter.  EBL: Minimal.  Complications: None.  Indications: Nathaniel Adams is a 67 year old white male Who was found to have a 3.8 cm enhancing left lateral wall mass on a CT done for GI bleed earlier this month.  He is to undergo cystoscopy with transurethral resection of the tumor along with possible left ureteral stenting and instillation of intravesical chemotherapy.  Procedure: He had been taken off his warfarin per routine and his INR was 1.25.  He was given 2 g of Ancef.  He was taken operating room where general anesthetic was induced.  He was placed in the lithotomy position and fitted with PAS hose.  His perineum and genitalia were prepped with Betadine solution he was draped in usual sterile fashion.  Cystoscopy was performed using a 23 [place] scope and 30 and 70 degree lenses.  Examination revealed a normal urethra.  The external sphincter was intact.  The prostatic urethra was approximately 3 cm in length with bilobar hyperplasia with mild obstruction.  The bladder wall had mild trabeculation there was a large papillary bladder tumor just lateral to the left ureteral orifice extending well up onto the left lateral wall and anterior bladder neck.  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  The right ureteral orifice was unremarkable.  The left ureteral  orifice was cannulated with 5 French opening catheter and contrast was instilled.  Th left retrograde pyelograme demonstrated a normal ureter and intrarenal collecting system without filling defects.  The ureteral catheter was then advanced to the kidney and a sensor guidewire was passed to stiffen the catheter.  The cystoscope was then removed and the urethra was calibrated to 30 [place] with [employer] sounds.  The 79 French continuous-flow resectoscope sheath was placed in the aid of the visual obturator.  It was then fitted with an Beatrix Fetters handle with a 30 degree lens and bipolar loop.  Saline was used as the irrigant.  The tumor was then resected down to the base.  I was able to avoid resection of the ureteral orifice but some cautery was required adjacent to the orifice but it was not felt to be sufficient to require stent.  The area of resection when complete exceeded 5 cm in size because of the mucosal changes at the base suggestive of carcinoma in situ.  Once the bulk of the tumor has been resected, the chips were removed.  I then resected several chips from the base of the tumor to ensure that the muscle was included in the specimen.  These were sent separately.  The tumor bed was generously fulgurated along with the surrounding mucosa to ensure hemostasis and destruction of all abnormal tissue.  The ureteral catheter come out during the procedure and on inspection it was not felt that the placement was indicated.  A 22 French Foley catheter was inserted and the balloon was filled with  10 cc of sterile fluid.  The catheter was irrigated with clear return in place to straight drainage.  He was taken down from lithotomy position, his anesthetic was reversed and he was moved to recovery in stable condition.  In the recovery room bladder was instilled with 2000 mg of gemcitabine which was left indwelling for 1 hour.  The bladder was then drained.  There were no complications.

## 2017-11-23 NOTE — Anesthesia Procedure Notes (Signed)
Procedure Name: Intubation Date/Time: 11/23/2017 9:17 AM Performed by: Claudia Desanctis, CRNA Pre-anesthesia Checklist: Patient identified, Emergency Drugs available, Suction available and Patient being monitored Patient Re-evaluated:Patient Re-evaluated prior to induction Oxygen Delivery Method: Circle system utilized Preoxygenation: Pre-oxygenation with 100% oxygen Induction Type: IV induction Ventilation: Two handed mask ventilation required Laryngoscope Size: 2 and Miller Grade View: Grade I Tube type: Oral Tube size: 7.5 mm Number of attempts: 1 Airway Equipment and Method: Stylet Placement Confirmation: ETT inserted through vocal cords under direct vision,  positive ETCO2 and breath sounds checked- equal and bilateral Secured at: 22 cm Tube secured with: Tape Dental Injury: Teeth and Oropharynx as per pre-operative assessment

## 2017-11-23 NOTE — Discharge Instructions (Addendum)
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    Further treatment will require a repeat resection of the tumor bed in 2-3 weeks and then BCG therapy for 6 weeks followed by 3 week maintenance BCG courses at 3 months and then 6 months from the reresection and then continued every 6 months for another 2.5 years.  I will look in the bladder every 3 months for 2 years and then at least annually. You will need a CT scan of the kidneys in 2 years      Bacillus Calmette-Guerin Live, BCG intravesical solution What is this medicine? BACILLUS CALMETTE-GUERIN LIVE, BCG (ba SIL [place] KAL met gay RAYN) is a bacteria solution. This medicine stimulates the immune system to ward off cancer cells. It is used to treat bladder cancer. This medicine may be used for other purposes; ask your health care provider or pharmacist if you have questions. COMMON BRAND NAME(S): Theracys, TICE BCG What should I tell my health care provider before I take this medicine? They need to know if you have any of these conditions: -aneurysm -blood in the urine -bladder biopsy within 2 weeks -fever or infection -immune system problems -leukemia -lymphoma -myasthenia gravis -need organ transplant -prosthetic device like arterial graft, artificial joint, prosthetic heart valve -recent or ongoing radiation therapy -tuberculosis -an unusual or allergic reaction to Bacillus Calmette-Guerin Live, BCG, latex, other medicines, foods, dyes, or preservatives -pregnant or trying to get pregnant -breast-feeding How should I use this medicine? This drug is given as a catheter infusion into the bladder. It is administered in a hospital or clinic by a specially trained health care professional. Dennis Bast will be given directions to follow before the treatment. Follow your doctor's directions carefully. Try to hold this medicine in your bladder for 2  hours after treatment. Talk to your pediatrician regarding the use of this medicine in children. Special care may be needed. Overdosage: If you think you have taken too much of this medicine contact a poison control center or emergency room at once. NOTE: This medicine is only for you. Do not share this medicine with others. What if I miss a dose? It is important not to miss your dose. Call your doctor or health care professional if you are unable to keep an appointment. What may interact with this medicine? -antibiotics -medicines to suppress your immune system like chemotherapy agents or corticosteroids -medicine to treat tuberculosis This list may not describe all possible interactions. Give your health care provider a list of all the medicines, herbs, non-prescription drugs, or dietary supplements you use. Also tell them if you smoke, drink alcohol, or use illegal drugs. Some items may interact with your medicine. What should I watch for while using this medicine? Visit your doctor for checks on your progress. This drug may make you feel generally unwell. Contact your doctor if your symptoms last more than 2 days or if they get worse. Call your doctor right away if you have a severe or unusual symptom. Infection can be spread to others through contact with this medicine. To prevent the spread of infection follow your doctor's directions carefully after treatment. For the first 6 hours after each treatment, sit down on the toilet to urinate. After urinating, add 2 cups of bleach to the toilet bowl and let set for 15 minutes before flushing. Wash your hands before and after using the restroom. Drink water or other  fluids as directed after treatment with this medicine. Do not become pregnant while taking this medicine. Women should inform their doctor if they wish to become pregnant or think they might be pregnant. There is a potential for serious side effects to an unborn child. Talk to your health  care professional or pharmacist for more information. Do not breast-feed an infant while taking this medicine. What side effects may I notice from receiving this medicine? Side effects that you should report to your doctor or health care professional as soon as possible: -allergic reactions like skin rash, itching or hives, swelling of the face, lips, or tongue -signs of infection - fever or chills, cough, sore throat, pain or difficulty passing urine -signs of decreased red blood cells - unusually weak or tired, fainting spells, lightheadedness -blood in urine -breathing problems -cough -eye pain, redness -flu-like symptoms -joint pain -bladder-area pain for more than 2 days after treatment -trouble passing urine or change in the amount of urine -vomiting -yellowing of the eyes or skin Side effects that usually do not require medical attention (report to your doctor or health care professional if they continue or are bothersome): -bladder spasm -burning when passing urine within 2 days of treatment -feel need to pass urine often or wake up at night to pass urine -loss of appetite This list may not describe all possible side effects. Call your doctor for medical advice about side effects. You may report side effects to FDA at 1-800-FDA-1088. Where should I keep my medicine? This drug is given in a hospital or clinic and will not be stored at home. NOTE: This sheet is a summary. It may not cover all possible information. If you have questions about this medicine, talk to your doctor, pharmacist, or health care provider.  2018 Elsevier/Gold Standard (2015-05-23 10:33:35) Bladder Cancer Bladder cancer is an abnormal growth of tissue in the bladder. The bladder is the balloon-like sac in the pelvis. It collects and stores urine that comes from the kidneys through the ureters. The bladder wall is made of layers. If cancer spreads into these layers and through the wall of the bladder, it  becomes more difficult to treat. What are the causes? The cause of this condition is not known. What increases the risk? The following factors may make you more likely to develop this condition:  Smoking.  Workplace risks (occupational exposures), such as rubber, leather, textile, dyes, chemicals, and paint.  Being white.  Your age. Most people with bladder cancer are over the age of 27.  Being male.  Having chronic bladder inflammation.  Having a personal history of bladder cancer.  Having a family history of bladder cancer (heredity).  Having had chemotherapy or radiation therapy to the pelvis.  Having been exposed to arsenic.  What are the signs or symptoms? Initial symptoms of this condition include:  Blood in the urine.  Painful urination.  Frequent bladder or urine infections.  Increase in urgency and frequency of urination.  Advanced symptoms of this condition include:  Not being able to urinate.  Low back pain on one side.  Loss of appetite.  Weight loss.  Fatigue.  Swelling in the feet.  Bone pain.  How is this diagnosed? This condition is diagnosed based on your medical history, a physical exam, urine tests, lab tests, imaging tests, and your symptoms. You may also have other tests or procedures done, such as:  A narrow tube being inserted into your bladder through your urethra (cystoscopy) in order to  view the lining of your bladder for tumors. °· A biopsy to sample the tumor to see if cancer is present. ° °If cancer is present, it will then be staged to determine its severity and extent. Staging is an assessment of: °· The size of the tumor. °· Whether the cancer has spread. °· Where the cancer has spread. ° °It is important to know how deeply into the bladder wall cancer has grown and whether cancer has spread to any other parts of your body. Staging may require blood tests or imaging tests, such as a CT scan, MRI, bone scan, or chest X-ray. °How  is this treated? °Based on the stage of cancer, one treatment or a combination of treatments may be recommended. The most common forms of treatment are: °· Surgery to remove the cancer. Procedures that may be done include transurethral resection and cystectomy. °· Radiation therapy. This is high-energy X-rays or other particles. This is often used in combination with chemotherapy. °· Chemotherapy. During this treatment, medicines are used to kill cancer cells. °· Immunotherapy. This uses medicines to help your own immune system destroy cancer cells. ° °Follow these instructions at home: °· Take over-the-counter and prescription medicines only as told by your health care provider. °· Maintain a healthy diet. Some of your treatments might affect your appetite. °· Consider joining a support group. This may help you learn to cope with the stress of having bladder cancer. °· Tell your cancer care team if you develop side effects. They may be able to recommend ways to relieve them. °· Keep all follow-up visits as told by your health care provider. This is important. °Where to find more information: °· American Cancer Society: www.cancer.org °· National Cancer Institute (NCI): www.cancer.gov °Contact a health care provider if: °· You have symptoms of a urinary tract infection. These include: °? Fever. °? Chills. °? Weakness. °? Muscle aches. °? Abdominal pain. °? Frequent and intense urge to urinate. °? Burning feeling in the bladder or urethra during urination. °Get help right away if: °· There is blood in your urine. °· You cannot urinate. °· You have severe pain or other symptoms that do not go away. °Summary °· Bladder cancer is an abnormal growth of tissue in the bladder. °· This condition is diagnosed based on your medical history, a physical exam, urine tests, lab tests, imaging tests, and your symptoms. °· Based on the stage of cancer, surgery, chemotherapy, or a combination of treatments may be  recommended. °· Consider joining a support group. This may help you learn to cope with the stress of having bladder cancer. °This information is not intended to replace advice given to you by your health care provider. Make sure you discuss any questions you have with your health care provider. °Document Released: 04/23/2003 Document Revised: 03/24/2016 Document Reviewed: 03/24/2016 °Elsevier Interactive Patient Education © 2018 [employer] Inc. °Transurethral Resection of Bladder Tumor, Care After °Refer to this sheet in the next few weeks. These instructions provide you with information about caring for yourself after your procedure. Your health care provider may also give you more specific instructions. Your treatment has been planned according to current medical practices, but problems sometimes occur. Call your health care provider if you have any problems or questions after your procedure. °What can I expect after the procedure? °After the procedure, it is common to have: °· A small amount of blood in your urine for up to 2 weeks. °· Soreness or mild discomfort from your catheter. After   your catheter is removed, you may have mild soreness, especially when urinating. °· Pain in your lower abdomen. ° °Follow these instructions at home: ° °Medicines °· Take over-the-counter and prescription medicines only as told by your health care provider. °· Do not drive or operate heavy machinery while taking prescription pain medicine. °· Do not drive for 24 hours if you received a sedative. °· If you were prescribed antibiotic medicine, take it as told by your health care provider. Do not stop taking the antibiotic even if you start to feel better. °Activity °· Return to your normal activities as told by your health care provider. Ask your health care provider what activities are safe for you. °· Do not lift anything that is heavier than 10 lb (4.5 kg) for as long as told by your health care provider. °· Avoid intense  physical activity for as long as told by your health care provider. °· Walk at least one time every day. This helps to prevent blood clots. You may increase your physical activity gradually as you start to feel better. °General instructions °· Do not drink alcohol for as long as told by your health care provider. This is especially important if you are taking prescription pain medicines. °· Do not take baths, swim, or use a hot tub until your health care provider approves. °· If you have a catheter, follow instructions from your health care provider about caring for your catheter and your drainage bag. °· Drink enough fluid to keep your urine clear or pale yellow. °· Wear compression stockings as told by your health care provider. These stockings help to prevent blood clots and reduce swelling in your legs. °· Keep all follow-up visits as told by your health care provider. This is important. °Contact a health care provider if: °· You have pain that gets worse or does not improve with medicine. °· You have blood in your urine for more than 2 weeks. °· You have cloudy or bad-smelling urine. °· You become constipated. Signs of constipation may include having: °? Fewer than three bowel movements in a week. °? Difficulty having a bowel movement. °? Stools that are dry, hard, or larger than normal. °· You have a fever. °Get help right away if: °· You have: °? Severe pain. °? Bright-red blood in your urine. °? Blood clots in your urine. °? A lot of blood in your urine. °· Your catheter has been removed and you are not able to urinate. °· You have a catheter in place and the catheter is not draining urine. ° °You may remove the catheter at home in the morning by cutting off the purple side arm.  The catheter should slide out easily once the fluid drains from the balloon.   If you don't feel comfortable doing that or you have trouble, please call the office to get it done. ° °You may resume the warfarin in 5 days.  °This  information is not intended to replace advice given to you by your health care provider. Make sure you discuss any questions you have with your health care provider. °Document Released: 04/01/2015 Document Revised: 12/22/2015 Document Reviewed: 01/10/2015 °Elsevier Interactive Patient Education © 2018 Elsevier Inc. ° °

## 2017-11-23 NOTE — Progress Notes (Signed)
Pt. C/o of pressure in his bladder, "same as earlier." This RN hand irrigated pt. Twice and was able to get minimal clots out. Input irrigation total of 120 mL and was able to get the same amount of return. Catheter had urine flowing properly after second irrigation. Pt. Expressed relief and pain medicine was given. Will continue to monitor pt. Closely.

## 2017-11-23 NOTE — Anesthesia Postprocedure Evaluation (Signed)
Anesthesia Post Note  Patient: Nathaniel Adams  Procedure(s) Performed: TRANSURETHRAL RESECTION OF BLADDER TUMOR WITH EPIRUBASIN (N/A ) CYSTOSCOPY WITH LEFT RETROGRADE PYELOGRAM (Left )     Patient location during evaluation: PACU Anesthesia Type: General Level of consciousness: awake and alert Pain management: pain level controlled Vital Signs Assessment: post-procedure vital signs reviewed and stable Respiratory status: spontaneous breathing, nonlabored ventilation, respiratory function stable and patient connected to nasal cannula oxygen Cardiovascular status: blood pressure returned to baseline and stable Postop Assessment: no apparent nausea or vomiting Anesthetic complications: no    Last Vitals:  Vitals:   11/23/17 1115 11/23/17 1130  BP: (!) 152/85 (!) 154/89  Pulse: 71 72  Resp: 15 18  Temp:    SpO2: 96% 96%    Last Pain:  Vitals:   11/23/17 1130  TempSrc:   PainSc: Asleep                 Lalla Laham S

## 2017-11-23 NOTE — Interval H&P Note (Signed)
History and Physical Interval Note:  11/23/2017 8:56 AM  Nathaniel Adams  has presented today for surgery, with the diagnosis of LEFT BLADDRER WALL MASS  The various methods of treatment have been discussed with the patient and family. After consideration of risks, benefits and other options for treatment, the patient has consented to  Procedure(s): TRANSURETHRAL RESECTION OF BLADDER TUMOR WITH EPIRUBASIN (N/A) CYSTOSCOPY WITH POSSIBLE LEFT RETROGRADE URETERAL STENT PLACEMENT (Left) as a surgical intervention .  The patient's history has been reviewed, patient examined, no change in status, stable for surgery.  I have reviewed the patient's chart and labs.  Questions were answered to the patient's satisfaction.     Irine Seal

## 2017-11-23 NOTE — Addendum Note (Signed)
Addendum  created 11/23/17 1445 by Claudia Desanctis, CRNA   Charge Capture section accepted

## 2017-11-23 NOTE — Transfer of Care (Signed)
Immediate Anesthesia Transfer of Care Note  Patient: Nathaniel Adams  Procedure(s) Performed: TRANSURETHRAL RESECTION OF BLADDER TUMOR WITH EPIRUBASIN (N/A ) CYSTOSCOPY WITH LEFT RETROGRADE PYELOGRAM (Left )  Patient Location: PACU  Anesthesia Type:General  Level of Consciousness: drowsy  Airway & Oxygen Therapy: Patient Spontanous Breathing and Patient connected to face mask  Post-op Assessment: Report given to RN and Post -op Vital signs reviewed and stable  Post vital signs: Reviewed and stable  Last Vitals:  Vitals Value Taken Time  BP 139/77 11/23/2017 10:25 AM  Temp    Pulse 71 11/23/2017 10:27 AM  Resp 22 11/23/2017 10:27 AM  SpO2 100 % 11/23/2017 10:27 AM  Vitals shown include unvalidated device data.  Last Pain:  Vitals:   11/23/17 0648  TempSrc: Oral         Complications: No apparent anesthesia complications

## 2017-11-23 NOTE — Anesthesia Preprocedure Evaluation (Addendum)
Anesthesia Evaluation  Patient identified by MRN, date of birth, ID band Patient awake    Reviewed: Allergy & Precautions, NPO status , Patient's Chart, lab work & pertinent test results  Airway Mallampati: II  TM Distance: >3 FB Neck ROM: Full    Dental no notable dental hx.    Pulmonary sleep apnea , COPD,    Pulmonary exam normal breath sounds clear to auscultation       Cardiovascular + CAD, + Past MI, + Cardiac Stents and + Peripheral Vascular Disease  Normal cardiovascular exam+ Cardiac Defibrillator  Rhythm:Regular Rate:Normal     Neuro/Psych negative neurological ROS  negative psych ROS   GI/Hepatic negative GI ROS, Neg liver ROS,   Endo/Other  diabetes, Insulin Dependent  Renal/GU negative Renal ROS  negative genitourinary   Musculoskeletal negative musculoskeletal ROS (+)   Abdominal   Peds negative pediatric ROS (+)  Hematology  (+) anemia ,   Anesthesia Other Findings   Reproductive/Obstetrics negative OB ROS                            Anesthesia Physical Anesthesia Plan  ASA: IV  Anesthesia Plan: General   Post-op Pain Management:    Induction: Intravenous  PONV Risk Score and Plan: 2  Airway Management Planned: Oral ETT  Additional Equipment:   Intra-op Plan:   Post-operative Plan: Extubation in OR  Informed Consent: I have reviewed the patients History and Physical, chart, labs and discussed the procedure including the risks, benefits and alternatives for the proposed anesthesia with the patient or authorized representative who has indicated his/her understanding and acceptance.   Dental advisory given  Plan Discussed with: CRNA and Surgeon  Anesthesia Plan Comments:         Anesthesia Quick Evaluation

## 2017-11-24 ENCOUNTER — Observation Stay (HOSPITAL_COMMUNITY): Payer: Medicare Other

## 2017-11-24 ENCOUNTER — Encounter (HOSPITAL_COMMUNITY): Payer: Self-pay | Admitting: Urology

## 2017-11-24 DIAGNOSIS — E875 Hyperkalemia: Secondary | ICD-10-CM

## 2017-11-24 DIAGNOSIS — Z79899 Other long term (current) drug therapy: Secondary | ICD-10-CM | POA: Diagnosis not present

## 2017-11-24 DIAGNOSIS — C679 Malignant neoplasm of bladder, unspecified: Secondary | ICD-10-CM | POA: Diagnosis present

## 2017-11-24 DIAGNOSIS — Z8042 Family history of malignant neoplasm of prostate: Secondary | ICD-10-CM | POA: Diagnosis not present

## 2017-11-24 DIAGNOSIS — Z87442 Personal history of urinary calculi: Secondary | ICD-10-CM | POA: Diagnosis not present

## 2017-11-24 DIAGNOSIS — J449 Chronic obstructive pulmonary disease, unspecified: Secondary | ICD-10-CM | POA: Diagnosis present

## 2017-11-24 DIAGNOSIS — Z8051 Family history of malignant neoplasm of kidney: Secondary | ICD-10-CM | POA: Diagnosis not present

## 2017-11-24 DIAGNOSIS — N183 Chronic kidney disease, stage 3 (moderate): Secondary | ICD-10-CM | POA: Diagnosis present

## 2017-11-24 DIAGNOSIS — Y838 Other surgical procedures as the cause of abnormal reaction of the patient, or of later complication, without mention of misadventure at the time of the procedure: Secondary | ICD-10-CM | POA: Diagnosis not present

## 2017-11-24 DIAGNOSIS — I252 Old myocardial infarction: Secondary | ICD-10-CM | POA: Diagnosis not present

## 2017-11-24 DIAGNOSIS — I4891 Unspecified atrial fibrillation: Secondary | ICD-10-CM

## 2017-11-24 DIAGNOSIS — Z7901 Long term (current) use of anticoagulants: Secondary | ICD-10-CM | POA: Diagnosis not present

## 2017-11-24 DIAGNOSIS — E1122 Type 2 diabetes mellitus with diabetic chronic kidney disease: Secondary | ICD-10-CM | POA: Diagnosis present

## 2017-11-24 DIAGNOSIS — C672 Malignant neoplasm of lateral wall of bladder: Secondary | ICD-10-CM | POA: Diagnosis not present

## 2017-11-24 DIAGNOSIS — I361 Nonrheumatic tricuspid (valve) insufficiency: Secondary | ICD-10-CM

## 2017-11-24 DIAGNOSIS — E1151 Type 2 diabetes mellitus with diabetic peripheral angiopathy without gangrene: Secondary | ICD-10-CM | POA: Diagnosis present

## 2017-11-24 DIAGNOSIS — Z95810 Presence of automatic (implantable) cardiac defibrillator: Secondary | ICD-10-CM | POA: Diagnosis not present

## 2017-11-24 DIAGNOSIS — H409 Unspecified glaucoma: Secondary | ICD-10-CM | POA: Diagnosis present

## 2017-11-24 DIAGNOSIS — K219 Gastro-esophageal reflux disease without esophagitis: Secondary | ICD-10-CM | POA: Diagnosis present

## 2017-11-24 DIAGNOSIS — N99820 Postprocedural hemorrhage and hematoma of a genitourinary system organ or structure following a genitourinary system procedure: Secondary | ICD-10-CM | POA: Diagnosis not present

## 2017-11-24 DIAGNOSIS — I130 Hypertensive heart and chronic kidney disease with heart failure and stage 1 through stage 4 chronic kidney disease, or unspecified chronic kidney disease: Secondary | ICD-10-CM | POA: Diagnosis present

## 2017-11-24 DIAGNOSIS — N179 Acute kidney failure, unspecified: Secondary | ICD-10-CM | POA: Diagnosis present

## 2017-11-24 DIAGNOSIS — E7800 Pure hypercholesterolemia, unspecified: Secondary | ICD-10-CM | POA: Diagnosis present

## 2017-11-24 DIAGNOSIS — I2510 Atherosclerotic heart disease of native coronary artery without angina pectoris: Secondary | ICD-10-CM | POA: Diagnosis present

## 2017-11-24 DIAGNOSIS — I480 Paroxysmal atrial fibrillation: Secondary | ICD-10-CM | POA: Diagnosis not present

## 2017-11-24 DIAGNOSIS — I10 Essential (primary) hypertension: Secondary | ICD-10-CM | POA: Diagnosis present

## 2017-11-24 DIAGNOSIS — R351 Nocturia: Secondary | ICD-10-CM | POA: Diagnosis present

## 2017-11-24 DIAGNOSIS — Z888 Allergy status to other drugs, medicaments and biological substances status: Secondary | ICD-10-CM | POA: Diagnosis not present

## 2017-11-24 LAB — ECHOCARDIOGRAM COMPLETE
Height: 69 in
Weight: 3664 oz

## 2017-11-24 LAB — BASIC METABOLIC PANEL
ANION GAP: 8 (ref 5–15)
ANION GAP: 8 (ref 5–15)
Anion gap: 8 (ref 5–15)
BUN: 39 mg/dL — AB (ref 8–23)
BUN: 43 mg/dL — AB (ref 8–23)
BUN: 44 mg/dL — AB (ref 8–23)
CO2: 27 mmol/L (ref 22–32)
CO2: 26 mmol/L (ref 22–32)
CO2: 26 mmol/L (ref 22–32)
Calcium: 8.3 mg/dL — ABNORMAL LOW (ref 8.9–10.3)
Calcium: 8.4 mg/dL — ABNORMAL LOW (ref 8.9–10.3)
Calcium: 8.3 mg/dL — ABNORMAL LOW (ref 8.9–10.3)
Chloride: 96 mmol/L — ABNORMAL LOW (ref 98–111)
Chloride: 97 mmol/L — ABNORMAL LOW (ref 98–111)
Chloride: 95 mmol/L — ABNORMAL LOW (ref 98–111)
Creatinine, Ser: 2.02 mg/dL — ABNORMAL HIGH (ref 0.61–1.24)
Creatinine, Ser: 2.17 mg/dL — ABNORMAL HIGH (ref 0.61–1.24)
Creatinine, Ser: 1.98 mg/dL — ABNORMAL HIGH (ref 0.61–1.24)
GFR calc Af Amer: 34 mL/min — ABNORMAL LOW (ref >60)
GFR calc Af Amer: 38 mL/min — ABNORMAL LOW (ref >60)
GFR, EST AFRICAN AMERICAN: 37 mL/min — AB (ref >60)
GFR, EST NON AFRICAN AMERICAN: 32 mL/min — AB (ref >60)
GFR, EST NON AFRICAN AMERICAN: 30 mL/min — AB (ref >60)
GFR, EST NON AFRICAN AMERICAN: 33 mL/min — AB (ref >60)
GLUCOSE: 259 mg/dL — AB (ref 70–99)
Glucose, Bld: 305 mg/dL — ABNORMAL HIGH (ref 70–99)
Glucose, Bld: 289 mg/dL — ABNORMAL HIGH (ref 70–99)
POTASSIUM: 6.8 mmol/L — AB (ref 3.5–5.1)
POTASSIUM: 6 mmol/L — AB (ref 3.5–5.1)
POTASSIUM: 5.9 mmol/L — AB (ref 3.5–5.1)
SODIUM: 131 mmol/L — AB (ref 135–145)
SODIUM: 131 mmol/L — AB (ref 135–145)
Sodium: 129 mmol/L — ABNORMAL LOW (ref 135–145)

## 2017-11-24 LAB — GLUCOSE, CAPILLARY
GLUCOSE-CAPILLARY: 272 mg/dL — AB (ref 70–99)
GLUCOSE-CAPILLARY: 232 mg/dL — AB (ref 70–99)
GLUCOSE-CAPILLARY: 189 mg/dL — AB (ref 70–99)
Glucose-Capillary: 171 mg/dL — ABNORMAL HIGH (ref 70–99)

## 2017-11-24 MED ORDER — DEXTROSE 50 % IV SOLN
25.0000 mL | Freq: Once | INTRAVENOUS | Status: AC
  Administered 2017-11-24: 25 mL via INTRAVENOUS
  Filled 2017-11-24: qty 50

## 2017-11-24 MED ORDER — INSULIN GLARGINE 100 UNIT/ML ~~LOC~~ SOLN
20.0000 [IU] | Freq: Every day | SUBCUTANEOUS | Status: DC
  Administered 2017-11-24 – 2017-11-25 (×2): 20 [IU] via SUBCUTANEOUS
  Filled 2017-11-24 (×3): qty 0.2

## 2017-11-24 MED ORDER — SODIUM BICARBONATE 8.4 % IV SOLN
50.0000 meq | Freq: Once | INTRAVENOUS | Status: AC
  Administered 2017-11-24: 50 meq via INTRAVENOUS
  Filled 2017-11-24: qty 50

## 2017-11-24 MED ORDER — SODIUM CHLORIDE 0.9 % IV SOLN
1.0000 g | Freq: Once | INTRAVENOUS | Status: AC
  Administered 2017-11-24: 1 g via INTRAVENOUS
  Filled 2017-11-24: qty 10

## 2017-11-24 MED ORDER — INSULIN ASPART 100 UNIT/ML ~~LOC~~ SOLN
6.0000 [IU] | Freq: Three times a day (TID) | SUBCUTANEOUS | Status: DC
  Administered 2017-11-24 – 2017-11-26 (×6): 6 [IU] via SUBCUTANEOUS

## 2017-11-24 MED ORDER — SODIUM CHLORIDE 0.9 % IV BOLUS
500.0000 mL | Freq: Once | INTRAVENOUS | Status: AC
  Administered 2017-11-24: 500 mL via INTRAVENOUS

## 2017-11-24 MED ORDER — INSULIN ASPART 100 UNIT/ML IV SOLN
10.0000 [IU] | Freq: Once | INTRAVENOUS | Status: AC
  Administered 2017-11-24: 10 [IU] via INTRAVENOUS

## 2017-11-24 MED ORDER — PERFLUTREN LIPID MICROSPHERE
INTRAVENOUS | Status: AC
  Filled 2017-11-24: qty 10

## 2017-11-24 MED ORDER — PERFLUTREN LIPID MICROSPHERE
1.0000 mL | INTRAVENOUS | Status: AC | PRN
Start: 2017-11-24 — End: 2017-11-24
  Administered 2017-11-24: 2 mL via INTRAVENOUS
  Filled 2017-11-24: qty 10

## 2017-11-24 NOTE — Consult Note (Addendum)
Medical Consultation  Nathaniel Adams  TWS:[id number]  DOB: [birth year]  DOA: 11/23/2017 PCP:   Veryl Speak at [facility]  Requesting physician: Dr Jeffie Pollock Date of consultation: 11/24/17 Reason for consultation: hyperkalemia, AKI  Impression/Recommendations Principal Problem:   Hyperkalemia/ AKI (acute kidney injury)  -  Cr 2.02 and K 6.8 - EKG:   > T wave inversion in 2,3,aVf, V5, V6- no peaked T waves - treat with D50, Insulin, IVF, Bicarb and Ca+ Gluconate- recheck in few hrs - upon review of his prior labs, last Cr on 7/7 was 1.39 which places him in CKD 3 category - hold Lasix, Aldactone, Digoxin, Avapro and Metformin- he received Lasix, Aldactone and Avapro yesterday  - suspect he is fluid depleted- he does admit to decreased urine output over the past few days prior to the surgery - he was given 250 cc of NS overnight and has been on 1/2 NS at 75 cc/hr- will give him 500 cc NS now   ADDENDUM: sodium is 129 ( he was on 1/2 NS infusion)  - CXR shows- recurrent left pleural effusion noted along with small right one- no dyspnea  -will stop IVF but encourage oral fluids - repeat K is 5.9 - recheck in AM     Active Problems:   Bladder cancer  - s/p TURBT and instillation of Gemcitabine - Warfarin on hold since colonoscopy and Aspirin on hold since his post polypectomy bleed on 7/6   Moderate sized Left pleural effusion-  - thoracentesis on 7/7 - Cytology is negative for cancer and fluid is consistent with a transudate which I have told him today as he did not have results communicated to him yet - when asked about CHF, he tells me, he was told he had congestive heart failure when he had his MI and next year when he had his AICD but has not ever been admitted subsequently for a CHF exacerbation -  as he has an AICD and is on diuretics, I suspect his EF was poor in the past - check ECHO and CXR    Liver masses -he  cannot have MRI due to AICD - I discussed his CT with a radiologist who states that lesions are non specific and recommends CT (with contrast) for  follow up in a  few months -  Radiologist further states that an ultrasound would not provide further information    DM (diabetes mellitus), type 2  - home med list mentions Metformin, Novolog and Lantus - sugar was 305 this AM prior to breakfast - will need to resume insulin but decrease the doses as Cr is higher than baseline and he is likely to eat less in the hospital - he takes 38 U Lantus QHS - will resume at 20 U - He also takes Novolog- 10, 15, 12 U with meals- will resume at 6 U with meals and continue a moderate sliding scale with this  CAD - follows with Dr Layne Benton at [facility] in [place]  - received 1 stent in 2006- EKG shows T wave inversions in inf/lat leads - no prior EKG to compare with - cont statin -   Aspirin was also held due to post polypectomy bleed- will wait till hematuria resolves prior to resuming    A-fib  - currently on NSR- cont tele - CHA2DS2-VASc Score at least 3- ECHO ordered for EF - Warfarin on hold as noted - currently passing clots- when hematuria improves, can start Heparin bridge with coumadin -  cont Coreg - Hold Digoxin for today due to AKI  AICD -placed in 2007 - s/p 2 changes of AICD most recently this past Dec  [facility] will followup again tomorrow. Please contact me if I can be of assistance in the meanwhile. Thank you for this consultation.     Chief Complaint: admitted by urology, Dr Jeffie Pollock, for cystoscopy  HPI:  Nathaniel Adams is an 67 y.o. male  with medical history of CAD s/p stent in 2006 at [facility], AICD in 2007, A-fib on Coumadin and DM2.   He has a recent incidental finding of a bladder mass.   He was admitted from 7/6-7/7 from [facility] for GI bleed (bright red blood suspected to be a post polypectomy bleed),  a CT showing liver lesions, pleural  effusions and a 3.8 cm mass in the bladder concerning for cancer.   CT abd pelvis report from [facility] is not present in Epic. HPI on 7/6, notes the following CT report:  3.8 cm polypoid mass in the left posterior bladder suspicious for carcinoma, several hyperenhancing nodules within the right lobe of the liver further evaluation with a dedicated abdominal MRI should be considered.  Cardiomegaly with small right pleural effusion and moderate left pleural effusion.  Small amount of radiodense material within the stomach.  Sigmoid colon diverticula without acute inflammation.  Mild to moderate aortic atherosclerosis without aneurysm or dissection.  No significant vascular stenosis within the abdomen or pelvis.    He underwent a thoracentesis on 7/7, GI bleeding stopped and he was discharged with recommendations to continue to hold Coumadin and aspirin for a cystoscopy with Dr Jeffie Pollock. Of note, he states that he has not yet received the results of the thoracentesis.   He was admitted to [facility] on 7/23 and underwent a cystoscopy with left retrograde pyelogram, transurethral resection of the bladder mass and instillation of gemcitabine without complications.  [facility] is consulted by Dr Jeffie Pollock as his K+ is 6.8, Cr is up and urine output has dropped. Per Dr Jeffie Pollock, there was no drop in BP in the OR , there was minimal blood loss and he has passed some clots via his foley overnight but there is no obstruction.  The patient states that he has been feeling reasonably well this past week but when specifically asked about his urine output, he does mention that he has not been making as much urine this week. He has no complaints otherwise.   Review of Systems  All other review of systems negative   Past Medical History:  Diagnosis Date  . Bladder mass    left wall bladder mass   . CAD (coronary artery disease)   . COPD (chronic obstructive pulmonary disease) ([place])   . Diabetes ([place])    type 2     . Hx of pleural effusion 11/2017   s/p thoracentesis  at [facility] [place] ; 11-19-17 reports no orthopnea, no coughing, no chest pain , no SOB with exertion , 02 resting 100%  . Myocardial infarction ([place]) 2006   blood clot caused heart attack   . OSA on CPAP 08/16/2017   diagnose 08/16/17  . Peripheral vascular disease ([place]) 2006   hx of blood clot causing MI     Past Surgical History:  Procedure Laterality Date  . ANGIOPLASTY  2006   with placement of 1 stent   . cardiac implant   04/13/2017   st jude defrbirillator ; Dr Milagros Reap    .  COLONOSCOPY W/ POLYPECTOMY  about a month ago  . CYSTOSCOPY W/ URETERAL STENT PLACEMENT Left 11/23/2017   Procedure: CYSTOSCOPY WITH LEFT RETROGRADE PYELOGRAM;  Surgeon: Irine Seal, MD;  Location: [facility];  Service: Urology;  Laterality: Left;  . THORACENTESIS  11/2017  . TRANSURETHRAL RESECTION OF BLADDER TUMOR WITH MITOMYCIN-C N/A 11/23/2017   Procedure: TRANSURETHRAL RESECTION OF BLADDER TUMOR WITH EPIRUBASIN;  Surgeon: Irine Seal, MD;  Location: [facility];  Service: Urology;  Laterality: N/A;    Social History:  reports that he has never smoked. He has never used smokeless tobacco. He reports that he drank alcohol. He reports that he has current or past drug history.  Allergies  Allergen Reactions  . Ace Inhibitors     Pt states "cough"    History reviewed. No pertinent family history.  Prior to Admission medications   Medication Sig Start Date End Date Taking? Authorizing Provider  acetaminophen (TYLENOL) 500 MG tablet Take 1,000 mg by mouth every 8 (eight) hours as needed for mild pain or headache.   Yes [provider]  atorvastatin (LIPITOR) 80 MG tablet Take 40 mg by mouth daily at 6 PM.   Yes [provider]  carvedilol (COREG) 25 MG tablet Take 37.5 mg by mouth 2 (two) times daily with a meal.   Yes [provider]  digoxin (LANOXIN) 0.125 MG tablet Take 0.125 mg by mouth daily.   Yes [provider]  insulin aspart (NOVOLOG) 100 UNIT/ML injection Inject 10-15 Units into the skin See admin instructions. Taking 10 units in the AM before Breakfast, 15 units at Lunch and 12 units in the PM.   Yes [provider]  insulin glargine (LANTUS) 100 UNIT/ML injection Inject 38 Units into the skin at bedtime.   Yes [provider]  loratadine (CLARITIN) 10 MG tablet Take 10 mg by mouth daily.   Yes [provider]  metFORMIN (GLUCOPHAGE) 1000 MG tablet Take 1,000 mg by mouth 2 (two) times daily with a meal.   Yes [provider]  spironolactone (ALDACTONE) 25 MG tablet Take 25 mg by mouth daily.   Yes [provider]  valsartan (DIOVAN) 80 MG tablet Take 40 mg by mouth 2 (two) times daily.   Yes [provider]  cholecalciferol (VITAMIN D) 1000 units tablet Take 1,000 Units by mouth daily.    [provider]  fluticasone (FLONASE) 50 MCG/ACT nasal spray Place 2 sprays into both nostrils daily.    [provider]  furosemide (LASIX) 20 MG tablet Take 20 mg by mouth daily.    [provider]  traMADol (ULTRAM) 50 MG tablet Take 1 tablet (50 mg total) by mouth every 6 (six) hours as needed. 11/23/17 11/23/18  Irine Seal, MD  warfarin (COUMADIN) 5 MG tablet Take 5 mg by mouth daily. Take one tablet by mouth once daily except take one an one-half tablets Wednesday    [provider]    Physical Exam: Blood pressure 140/69, pulse 76, temperature 98.7 F (37.1 C), temperature source Oral, resp. rate 16, height 5\' 9"  (1.753 m), weight 103.9 kg (229 lb), SpO2 100 %. @VITALS2 @ [employer]   11/23/17 0709  Weight: 103.9 kg (229 lb)    Intake/Output Summary (Last 24 hours) at 11/24/2017 0736 Last data filed at 11/24/2017 8101 Gross per 24 hour  Intake 3159.9 ml  Output 961 ml  Net 2198.9 ml     Constitutional: Appears well-developed and well-nourished. No distress. HENT: Normocephalic. External right  and left ear normal. Oropharynx is clear and moist.  Eyes: Conjunctivae and EOM are normal. PERRLA, no scleral icterus.  Neck: Normal ROM. Neck supple. No JVD. No tracheal deviation. No thyromegaly.  CVS: RRR, S1/S2 +, no murmurs, no gallops, no carotid bruit.  Pulmonary: Effort and breath sounds normal, no stridor, rhonchi, wheezes, rales.  Abdominal: Soft. BS +,  no distension, tenderness, rebound or guarding.  Musculoskeletal: Normal range of motion. No edema and no tenderness.  Neuro: Alert. Normal reflexes, muscle tone coordination. No cranial nerve deficit. Skin: Skin is warm and dry. No rash noted. Not diaphoretic. No erythema. No pallor.  Psychiatric: Normal mood and affect. Behavior, judgment, thought content normal.    Labs on Admission:  Basic Metabolic Panel: Recent Labs  Lab 11/19/17 1038 11/24/17 0505  NA 144 131*  K 5.2* 6.8*  CL 107 96*  CO2 30 27  GLUCOSE 139* 305*  BUN 22 39*  CREATININE 1.32* 2.02*  CALCIUM 9.3 8.3*   Liver Function Tests: No results for input(s): AST, ALT, ALKPHOS, BILITOT, PROT, ALBUMIN in the last 168 hours. No results for input(s): LIPASE, AMYLASE in the last 168 hours. No results for input(s): AMMONIA in the last 168 hours. CBC: Recent Labs  Lab 11/19/17 1038  WBC 6.1  HGB 10.0*  HCT 31.4*  MCV 88.2  PLT 191   Cardiac Enzymes: No results for input(s): CKTOTAL, CKMB, CKMBINDEX, TROPONINI in the last 168 hours. BNP: Invalid input(s): POCBNP CBG: Recent Labs  Lab 11/23/17 0647 11/23/17 1033 11/23/17 1729 11/23/17 2147 11/24/17 0727  GLUCAP 144* 128* 299* 283* 272*    Radiological Exams on Admission: Dg C-arm 1-60 Min-no Report  Result Date: 11/23/2017 Fluoroscopy was utilized by the requesting physician.  No radiographic interpretation.      Time spent: 50 min  Debbe Odea, MD [facility] To page rounding or on call physician  [email or web address] 11/24/2017, 7:36 AM

## 2017-11-24 NOTE — Progress Notes (Signed)
This RN discussed with Dr. Jeffie Pollock on the floor about critical potassium of 6.8. Pt. Was put on telemetry per verbal orders along with a STAT BMP verbal order. Pt. And wife at bedside made aware and educated. Will continue to monitor pt.

## 2017-11-24 NOTE — Progress Notes (Signed)
Patient ID: Nathaniel Adams, male   DOB: [birth year], 67 y.o.   MRN: [id number]  Nathaniel Adams is doing better with clear urine and improved UOP.   His Cr is coming back down as is the potassium.  I appreciate Dr. Reggy Eye input.  I will d/c the foley.

## 2017-11-24 NOTE — Progress Notes (Signed)
CRITICAL VALUE ALERT  Critical Value:  K 6.8  Date & Time Notied:  11/24/17 0630  Provider Notified: C. Wood   Orders Received/Actions taken: No new orders

## 2017-11-24 NOTE — Progress Notes (Signed)
  Echocardiogram 2D Echocardiogram has been performed.  Nathaniel Adams 11/24/2017, 4:19 PM

## 2017-11-24 NOTE — Progress Notes (Signed)
Patient ID: Nathaniel Adams, male   DOB: 1951-04-26, 67 y.o.   MRN: [id number]  Repeat K is 6.0 but the Cr is up to 2.17.     I have reviewed the literature on the Gemcitabine instillation and AKI was not a documented side effect.

## 2017-11-24 NOTE — Progress Notes (Signed)
1 Day Post-Op  Subjective: Nathaniel Adams had some bladder spasms in the night and was irrigated 3 x's with return of a small amount of clot.   He is oliguria this morning with slightly bloody urine in the tubing.  He got a 227ml NS bolus last night.   His Cr is up to 2.02 this am and his K is up to 6.8. Na is 131.  He has no chest pain or other specific complaints at this time.  ROS:  Review of Systems  Constitutional: Negative for fever.  Respiratory: Negative for shortness of breath.   Cardiovascular: Negative for chest pain.    Anti-infectives: Anti-infectives (From admission, onward)   Start     Dose/Rate Route Frequency Ordered Stop   11/23/17 0655  ceFAZolin (ANCEF) 2-4 GM/100ML-% IVPB    Note to Pharmacy:  Randa Evens  : cabinet override      11/23/17 0655 11/23/17 0911   11/23/17 0649  ceFAZolin (ANCEF) IVPB 2g/100 mL premix     2 g 200 mL/hr over 30 Minutes Intravenous 30 min pre-op 11/23/17 5397 11/23/17 0921      Current Facility-Administered Medications  Medication Dose Route Frequency Provider Last Rate Last Dose  . 0.45 % sodium chloride infusion   Intravenous Continuous Irine Seal, MD 75 mL/hr at 11/24/17 0323    . 0.9 %  sodium chloride infusion  250 mL Intravenous PRN Irine Seal, MD      . acetaminophen (TYLENOL) tablet 650 mg  650 mg Oral Q4H PRN Irine Seal, MD       Or  . acetaminophen (TYLENOL) suppository 650 mg  650 mg Rectal Q4H PRN Irine Seal, MD      . atorvastatin (LIPITOR) tablet 40 mg  40 mg Oral q1800 Irine Seal, MD   40 mg at 11/23/17 1746  . bisacodyl (DULCOLAX) suppository 10 mg  10 mg Rectal Daily PRN Irine Seal, MD      . calcium gluconate 1 g in sodium chloride 0.9 % 100 mL IVPB  1 g Intravenous Once Debbe Odea, MD 110 mL/hr at 11/24/17 0816 1 g at 11/24/17 0816  . carvedilol (COREG) tablet 37.5 mg  37.5 mg Oral BID WC Irine Seal, MD   37.5 mg at 11/24/17 0817  . diphenhydrAMINE (BENADRYL) injection 12.5 mg  12.5 mg Intravenous Q6H  PRN Irine Seal, MD       Or  . diphenhydrAMINE (BENADRYL) 12.5 MG/5ML elixir 12.5 mg  12.5 mg Oral Q6H PRN Irine Seal, MD      . fluticasone Asencion Islam) 50 MCG/ACT nasal spray 2 spray  2 spray Each Nare Daily Irine Seal, MD   2 spray at 11/24/17 [phone]  . HYDROcodone-acetaminophen (NORCO/VICODIN) 5-325 MG per tablet 1-2 tablet  1-2 tablet Oral Q4H PRN Irine Seal, MD   1 tablet at 11/23/17 1438  . insulin aspart (novoLOG) injection 0-15 Units  0-15 Units Subcutaneous TID WC Irine Seal, MD   8 Units at 11/24/17 [phone]  . loratadine (CLARITIN) tablet 10 mg  10 mg Oral Daily Irine Seal, MD   10 mg at 11/24/17 0817  . morphine 2 MG/ML injection 2 mg  2 mg Intravenous Q2H PRN Irine Seal, MD   2 mg at 11/23/17 2046  . ondansetron (ZOFRAN) injection 4 mg  4 mg Intravenous Q4H PRN Irine Seal, MD      . opium-belladonna (B&O SUPPRETTES) 16.2-60 MG suppository 1 suppository  1 suppository Rectal Q6H PRN Irine Seal, MD   1 suppository  at 11/24/17 0302  . oxyCODONE (Oxy IR/ROXICODONE) immediate release tablet 5-10 mg  5-10 mg Oral Q4H PRN Irine Seal, MD      . senna-docusate (Senokot-S) tablet 1 tablet  1 tablet Oral QHS PRN Irine Seal, MD      . sodium chloride 0.9 % bolus 500 mL  500 mL Intravenous Once Debbe Odea, MD 500 mL/hr at 11/24/17 0815 500 mL at 11/24/17 0815  . sodium chloride flush (NS) 0.9 % injection 3 mL  3 mL Intravenous Q12H Irine Seal, MD   3 mL at 11/23/17 2047  . sodium chloride flush (NS) 0.9 % injection 3 mL  3 mL Intravenous PRN Irine Seal, MD      . sodium phosphate (FLEET) 7-19 GM/118ML enema 1 enema  1 enema Rectal Once PRN Irine Seal, MD      . zolpidem (AMBIEN) tablet 5 mg  5 mg Oral QHS PRN Irine Seal, MD         Objective: Vital signs in last 24 hours: Temp:  [97.6 F (36.4 C)-98.7 F (37.1 C)] 98.7 F (37.1 C) (07/24 0726) Pulse Rate:  [65-84] 76 (07/24 0726) Resp:  [11-19] 16 (07/24 0726) BP: (113-157)/(59-114) 140/69 (07/24 0726) SpO2:  [92 %-100 %] 100 %  (07/24 0726)  Intake/Output from previous day: 07/23 0701 - 07/24 0700 In: 3159.9 [P.O.:240; I.V.:2217; IV Piggyback:250.1] Out: 961 [Urine:950; Stool:1; Blood:10] Intake/Output this shift: No intake/output data recorded.   Physical Exam  Constitutional: He appears well-developed and well-nourished.  Cardiovascular: Normal rate, regular rhythm and normal heart sounds.  Pulmonary/Chest: Effort normal and breath sounds normal. No respiratory distress.  Abdominal: Soft. There is no tenderness.  Genitourinary:  Genitourinary Comments: Foley has small amount of lightly bloody urine in the tubing.   Musculoskeletal: Normal range of motion. He exhibits no edema or tenderness.  Vitals reviewed.   Lab Results:  No results for input(s): WBC, HGB, HCT, PLT in the last 72 hours. BMET Recent Labs    11/24/17 0505  NA 131*  K 6.8*  CL 96*  CO2 27  GLUCOSE 305*  BUN 39*  CREATININE 2.02*  CALCIUM 8.3*   PT/INR Recent Labs    11/23/17 0652  LABPROT 15.6*  INR 1.25   ABG No results for input(s): PHART, HCO3 in the last 72 hours.  Invalid input(s): PCO2, PO2  Studies/Results: Dg C-arm 1-60 Min-no Report  Result Date: 11/23/2017 Fluoroscopy was utilized by the requesting physician.  No radiographic interpretation.   Labs reviewed.   Assessment and Plan: Bladder cancer.   He had some hematuria over night requiring irrigation but is comfortable with the foley.   He is oliguria.  AKI with hyperkalemia.   I have requested an urgent hospitalist consult for further evaluation and Dr. Wynelle Cleveland will be seeing the patient this morning.   The repeat BMP is pending.       LOS: 0 days    Irine Seal 11/24/2017 [phone] ID: [name], male   DOB: [birth year], 67 y.o.   MRN: [id number]

## 2017-11-25 DIAGNOSIS — N179 Acute kidney failure, unspecified: Secondary | ICD-10-CM

## 2017-11-25 DIAGNOSIS — E875 Hyperkalemia: Principal | ICD-10-CM

## 2017-11-25 DIAGNOSIS — I480 Paroxysmal atrial fibrillation: Secondary | ICD-10-CM

## 2017-11-25 DIAGNOSIS — C679 Malignant neoplasm of bladder, unspecified: Secondary | ICD-10-CM

## 2017-11-25 DIAGNOSIS — Z794 Long term (current) use of insulin: Secondary | ICD-10-CM

## 2017-11-25 DIAGNOSIS — E118 Type 2 diabetes mellitus with unspecified complications: Secondary | ICD-10-CM

## 2017-11-25 DIAGNOSIS — J90 Pleural effusion, not elsewhere classified: Secondary | ICD-10-CM

## 2017-11-25 LAB — BASIC METABOLIC PANEL
Anion gap: 8 (ref 5–15)
BUN: 46 mg/dL — ABNORMAL HIGH (ref 8–23)
CO2: 26 mmol/L (ref 22–32)
Calcium: 8.7 mg/dL — ABNORMAL LOW (ref 8.9–10.3)
Chloride: 99 mmol/L (ref 98–111)
Creatinine, Ser: 1.66 mg/dL — ABNORMAL HIGH (ref 0.61–1.24)
GFR calc Af Amer: 47 mL/min — ABNORMAL LOW (ref >60)
GFR calc non Af Amer: 41 mL/min — ABNORMAL LOW (ref >60)
Glucose, Bld: 202 mg/dL — ABNORMAL HIGH (ref 70–99)
Potassium: 5.1 mmol/L (ref 3.5–5.1)
Sodium: 133 mmol/L — ABNORMAL LOW (ref 135–145)

## 2017-11-25 LAB — GLUCOSE, CAPILLARY
GLUCOSE-CAPILLARY: 184 mg/dL — AB (ref 70–99)
GLUCOSE-CAPILLARY: 206 mg/dL — AB (ref 70–99)
GLUCOSE-CAPILLARY: 133 mg/dL — AB (ref 70–99)
Glucose-Capillary: 185 mg/dL — ABNORMAL HIGH (ref 70–99)

## 2017-11-25 MED ORDER — FUROSEMIDE 20 MG PO TABS
20.0000 mg | ORAL_TABLET | Freq: Every day | ORAL | Status: DC
  Administered 2017-11-25 – 2017-11-26 (×2): 20 mg via ORAL
  Filled 2017-11-25 (×2): qty 1

## 2017-11-25 MED ORDER — FUROSEMIDE 20 MG PO TABS: 20.0000 mg | ORAL_TABLET | Freq: Every day | ORAL | Status: DC

## 2017-11-25 NOTE — Progress Notes (Signed)
Consult/PROGRESS NOTE    Nathaniel Adams  RJJ:[id number] DOB: 06-15-1950 DOA: 11/23/2017 PCP: System, Pcp Not In   Brief Narrative:  Patient 67 year old gentleman history of coronary artery disease status post stent 2006 at [facility], status post AICD 2007, A. fib on chronic Coumadin therapy and diabetes recent finding of incidental bladder mass.  Patient was admitted at [facility] 11/06/2017 to 11/07/2017 secondary to GI blood felt likely secondary to post polypectomy bleed.  CT done showed liver lesions, pleural effusion and a 3.8 cm mass in the bladder concerning for cancer.  Patient admitted to the urology service on 11/23/2017 underwent cystoscopy with left retrograde pyelogram, TURP of bladder mass and instillation of gemcitabine without any complications.  [facility] hospital[facility] were consulted for hyperkalemia with a potassium of 6.8, acute on chronic kidney disease.   Assessment & Plan:   Principal Problem:   Hyperkalemia Active Problems:   DM (diabetes mellitus), type 2 (HCC)   COPD (chronic obstructive pulmonary disease) (HCC)   Bladder cancer (HCC)   AKI (acute kidney injury) ([place])   A-fib ([place])  #1 hyperkalemia/acute kidney injury on chronic kidney disease stage II/III Creatinine noted to be as high as 2.02 with a baseline creatinine of 1.32 from last lab work from 11/19/2017.  Potassium noted to be as high as 6.8.  EKG which was done showed T wave inversion in leads II, III, aVF, V5 and V6 with no peak T waves.  Patient status post D50, insulin, IV fluids, bicarb, calcium gluconate.  Potassium improving currently at 5.1. Patient also noted to have been on Lasix, Aldactone, digoxin, Avapro and metformin.  Patient's Lasix, Aldactone, Avapro, digoxin and metformin have been on hold.  Patient gently hydrated.  IV fluids subsequently discontinued.  Follow.  2.  Bladder cancer Status post TURBT with instillation of gemcitabine.  Coumadin on hold since colonoscopy and aspirin on  hold since post polypectomy 11/06/2017.  Per primary team, urology patient to hold Coumadin for 5 days.  3.  Moderate-sized left pleural effusion Status post thoracentesis 11/07/2017.  Cytology negative for cancer and effusion consistent with a transudate of effusion patient with history of CHF -Status post AICD on diuretics.  2D echo done with a EF of 25 to 30%, moderately dilated left atrium, trivial pericardial effusion identified, elevated ventricular end-diastolic filling pressures and elevated left atrial filling pressures noted.  Patient's diuretics, Aldactone, digoxin, Avapro on hold due to hyperkalemia and acute on chronic kidney disease stage III.  Patient with no chest pain.  Patient with some lower extremity edema.  Will resume patient's Lasix in the morning 11/26/2017.  Continue to hold Aldactone, digoxin, Avapro and could likely resume dose in 3 to 4 days on follow-up with his cardiologist.  4.  Coronary artery disease/status post AICD Patient follows up with Dr. Layne Benton, [place], [place].  Patient status post stenting 2006.  EKG shows T wave inversions inferior and lateral leads with no prior EKG to compare with.  Patient denies any chest pain.  2D echo done with a EF of 25 to 30%.  Patient status post AICD.  Aspirin and Coumadin on hold secondary to recent post polypectomy bleed, hematuria and bladder cancer.  ARB, Aldactone, digoxin, Lasix on hold due to acute renal failure and hyperkalemia.  Will resume Lasix tomorrow 11/26/2017.  Continue to hold Aldactone, Avapro, digoxin and could likely resume dose in 3 to 4 days.  Will need close outpatient follow-up with his cardiologist.  5.  Atrial fibrillation CHA2DS2VASC score at  least 3.  Currently rate controlled.  Digoxin on hold due to acute kidney injury.  Patient with hematuria.  Patient with bladder cancer status post TURBT.  Per primary team continue to hold anticoagulation for at least 5 days.  Continue Coreg for rate control.  6.   Diabetes mellitus type 2 Metformin on hold.  Continue Lantus and NovoLog.  Continue sliding scale insulin.  Continue meal coverage insulin.  Follow.   DVT prophylaxis: SCDs Code Status: Full Family Communication: Updated patient and wife at bedside. Disposition Plan: Likely home hopefully in the next 24 hours.   Consultants:   [facility] hospitalist: Dr. Wynelle Cleveland 11/24/2017  Procedures:   Chest x-ray 11/24/2017  2D echo 11/24/2017  Antimicrobials:   None   Subjective: Patient laying in bed.  Denies any chest pain.  Denies any shortness of breath.  States hematuria improving.  Objective: Vitals:   11/24/17 0726 11/24/17 1452 11/24/17 2118 11/25/17 0514  BP: 140/69 122/67 122/66 136/73  Pulse: 76 73 73 79  Resp: 16 16 (!) 23 20  Temp: 98.7 F (37.1 C) 98 F (36.7 C) 98.1 F (36.7 C) (!) 97.4 F (36.3 C)  TempSrc: Oral Oral Oral Oral  SpO2: 100% 98% 98% 100%  Weight:      Height:        Intake/Output Summary (Last 24 hours) at 11/25/2017 1129 Last data filed at 11/25/2017 0900 Gross per 24 hour  Intake 1277.5 ml  Output 1450 ml  Net -172.5 ml   Filed Weights   11/23/17 0709  Weight: 103.9 kg (229 lb)    Examination:  General exam: Appears calm and comfortable. Respiratory system: Decreased breath sounds in the bases.  No rhonchi.  Respiratory effort normal. Cardiovascular system: Regular rate and rhythm no murmurs rubs or gallops.  No JVD.  1-2+ bilateral lower extremity edema. Gastrointestinal system: Abdomen is soft, nontender, nondistended, positive bowel sounds.  No hepatosplenomegaly.  Central nervous system: Alert and oriented. No focal neurological deficits. Extremities: Symmetric 5 x 5 power. Skin: No rashes, lesions or ulcers Psychiatry: Judgement and insight appear normal. Mood & affect appropriate.     Data Reviewed: I have personally reviewed following labs and imaging studies  CBC: Recent Labs  Lab 11/19/17 1038  WBC 6.1  HGB 10.0*  HCT  31.4*  MCV 88.2  PLT 681   Basic Metabolic Panel: Recent Labs  Lab 11/19/17 1038 11/24/17 0505 11/24/17 0750 11/24/17 1246 11/25/17 0514  NA 144 131* 131* 129* 133*  K 5.2* 6.8* 6.0* 5.9* 5.1  CL 107 96* 97* 95* 99  CO2 30 27 26 26 26   GLUCOSE 139* 305* 289* 259* 202*  BUN 22 39* 43* 44* 46*  CREATININE 1.32* 2.02* 2.17* 1.98* 1.66*  CALCIUM 9.3 8.3* 8.4* 8.3* 8.7*   GFR: Estimated Creatinine Clearance: 50.6 mL/min (A) (by C-G formula based on SCr of 1.66 mg/dL (H)). Liver Function Tests: No results for input(s): AST, ALT, ALKPHOS, BILITOT, PROT, ALBUMIN in the last 168 hours. No results for input(s): LIPASE, AMYLASE in the last 168 hours. No results for input(s): AMMONIA in the last 168 hours. Coagulation Profile: Recent Labs  Lab 11/19/17 1048 11/23/17 0652  INR 1.92 1.25   Cardiac Enzymes: No results for input(s): CKTOTAL, CKMB, CKMBINDEX, TROPONINI in the last 168 hours. BNP (last 3 results) No results for input(s): PROBNP in the last 8760 hours. HbA1C: No results for input(s): HGBA1C in the last 72 hours. CBG: Recent Labs  Lab 11/24/17 0727 11/24/17 1132 11/24/17  1655 11/24/17 2115 11/25/17 0741  GLUCAP 272* 232* 171* 189* 184*   Lipid Profile: No results for input(s): CHOL, HDL, LDLCALC, TRIG, CHOLHDL, LDLDIRECT in the last 72 hours. Thyroid Function Tests: No results for input(s): TSH, T4TOTAL, FREET4, T3FREE, THYROIDAB in the last 72 hours. Anemia Panel: No results for input(s): VITAMINB12, FOLATE, FERRITIN, TIBC, IRON, RETICCTPCT in the last 72 hours. Sepsis Labs: No results for input(s): PROCALCITON, LATICACIDVEN in the last 168 hours.  No results found for this or any previous visit (from the past 240 hour(s)).       Radiology Studies: Dg Chest Port 1 View  Result Date: 11/24/2017 CLINICAL DATA:  Follow-up pleural effusions. EXAM: PORTABLE CHEST 1 VIEW COMPARISON:  Chest x-ray 11/07/2017. Visualized lung bases on CT abdomen and pelvis  11/06/2017. FINDINGS: Cardiac silhouette moderately enlarged for AP portable technique. LEFT subclavian pacing defibrillator. Thoracic aorta atherosclerotic. Pulmonary venous hypertension without overt pulmonary edema. Moderate-sized LEFT pleural effusion, recurrent after the prior thoracentesis. Dense consolidation in the LEFT LOWER LOBE with worsening aeration since the prior exam. Small RIGHT pleural effusion, unchanged. IMPRESSION: 1. Recurrent LEFT pleural effusion. Stable small RIGHT pleural effusion. 2. Passive atelectasis and/or pneumonia involving the LEFT LOWER LOBE. 3. Stable cardiomegaly. Pulmonary venous hypertension without overt edema. Electronically Signed   By: Evangeline Dakin M.D.   On: 11/24/2017 11:14        Scheduled Meds: . atorvastatin  40 mg Oral q1800  . carvedilol  37.5 mg Oral BID WC  . fluticasone  2 spray Each Nare Daily  . insulin aspart  0-15 Units Subcutaneous TID WC  . insulin aspart  6 Units Subcutaneous TID WC  . insulin glargine  20 Units Subcutaneous QHS  . loratadine  10 mg Oral Daily  . sodium chloride flush  3 mL Intravenous Q12H   Continuous Infusions: . sodium chloride       LOS: 1 day    Time spent: 35 mins    Irine Seal, MD [facility] Pager [phone]  If 7PM-7AM, please contact night-coverage [email or web address] Password [facility] 11/25/2017, 11:29 AM

## 2017-11-25 NOTE — Progress Notes (Signed)
2 Days Post-Op  Subjective: Mr. Nathaniel Adams is doing well this morning and feels much better.  He has an excellent UOP without hematuria.  His Cr is down to 1.66 with a K of 5.1 and a sodium of 133.  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  ROS:  Review of Systems  Constitutional: Negative for fever.  Respiratory: Negative for shortness of breath.   Cardiovascular: Negative for chest pain.    Anti-infectives: Anti-infectives (From admission, onward)   Start     Dose/Rate Route Frequency Ordered Stop   11/23/17 0655  ceFAZolin (ANCEF) 2-4 GM/100ML-% IVPB    Note to Pharmacy:  Randa Evens  : cabinet override      11/23/17 0655 11/23/17 0911   11/23/17 0649  ceFAZolin (ANCEF) IVPB 2g/100 mL premix     2 g 200 mL/hr over 30 Minutes Intravenous 30 min pre-op 11/23/17 2423 11/23/17 0921      Current Facility-Administered Medications  Medication Dose Route Frequency Provider Last Rate Last Dose  . 0.9 %  sodium chloride infusion  250 mL Intravenous PRN Irine Seal, MD      . acetaminophen (TYLENOL) tablet 650 mg  650 mg Oral Q4H PRN Irine Seal, MD       Or  . acetaminophen (TYLENOL) suppository 650 mg  650 mg Rectal Q4H PRN Irine Seal, MD      . atorvastatin (LIPITOR) tablet 40 mg  40 mg Oral q1800 Irine Seal, MD   40 mg at 11/24/17 1805  . bisacodyl (DULCOLAX) suppository 10 mg  10 mg Rectal Daily PRN Irine Seal, MD      . carvedilol (COREG) tablet 37.5 mg  37.5 mg Oral BID WC Irine Seal, MD   37.5 mg at 11/24/17 1804  . diphenhydrAMINE (BENADRYL) injection 12.5 mg  12.5 mg Intravenous Q6H PRN Irine Seal, MD       Or  . diphenhydrAMINE (BENADRYL) 12.5 MG/5ML elixir 12.5 mg  12.5 mg Oral Q6H PRN Irine Seal, MD      . fluticasone Asencion Islam) 50 MCG/ACT nasal spray 2 spray  2 spray Each Nare Daily Irine Seal, MD   2 spray at 11/24/17 [phone]  . HYDROcodone-acetaminophen (NORCO/VICODIN) 5-325 MG per tablet 1-2 tablet  1-2 tablet Oral Q4H PRN Irine Seal, MD   1 tablet at 11/23/17 1438  . insulin aspart (novoLOG) injection 0-15 Units  0-15 Units Subcutaneous TID WC Irine Seal, MD   3 Units at 11/24/17 1804  . insulin aspart (novoLOG) injection 6 Units  6 Units Subcutaneous TID WC Debbe Odea, MD   6 Units at 11/24/17 1804  . insulin glargine (LANTUS) injection 20 Units  20 Units Subcutaneous QHS Debbe Odea, MD   20 Units at 11/24/17 2123  . loratadine (CLARITIN) tablet 10 mg  10 mg Oral Daily Irine Seal, MD   10 mg at 11/24/17 0817  . morphine 2 MG/ML injection 2 mg  2 mg Intravenous Q2H PRN Irine Seal, MD   2 mg at 11/23/17 2046  . ondansetron (ZOFRAN) injection 4 mg  4 mg Intravenous Q4H PRN Irine Seal, MD      . opium-belladonna (B&O SUPPRETTES) 16.2-60 MG suppository 1 suppository  1 suppository Rectal Q6H PRN Irine Seal, MD   1 suppository at 11/24/17 0302  . oxyCODONE (Oxy IR/ROXICODONE) immediate release tablet 5-10 mg  5-10 mg Oral Q4H PRN Irine Seal, MD      . senna-docusate (Senokot-S) tablet 1 tablet  1 tablet Oral QHS PRN Irine Seal, MD      . sodium chloride flush (NS) 0.9 % injection 3 mL  3 mL Intravenous Q12H Irine Seal, MD   3 mL at 11/24/17 2124  . sodium chloride flush (NS) 0.9 % injection 3 mL  3 mL Intravenous PRN Irine Seal, MD      . sodium phosphate (FLEET) 7-19 GM/118ML enema 1 enema  1 enema Rectal Once PRN Irine Seal, MD      . zolpidem (AMBIEN) tablet 5 mg  5 mg Oral QHS PRN Irine Seal, MD         Objective: Vital signs in last 24 hours: Temp:  [97.4 F (36.3 C)-98.7 F (37.1 C)] 97.4 F (36.3 C) (07/25 0514) Pulse Rate:  [73-79] 79 (07/25 0514) Resp:  [16-23] 20 (07/25 0514) BP: (122-140)/(66-73) 136/73 (07/25 0514) SpO2:  [98 %-100 %] 100 % (07/25 0514)  Intake/Output from previous day: 07/24 0701 - 07/25 0700 In: 1277.5 [P.O.:480; I.V.:797.5] Out: 1450 [Urine:1450] Intake/Output this shift: No intake/output data recorded.   Physical Exam  Constitutional: He appears  well-developed and well-nourished.  Cardiovascular: Normal rate, regular rhythm and normal heart sounds.  Pulmonary/Chest: Effort normal. No respiratory distress.  Musculoskeletal: Normal range of motion. He exhibits no edema or tenderness.  Vitals reviewed.   Lab Results:  No results for input(s): WBC, HGB, HCT, PLT in the last 72 hours. BMET Recent Labs    11/24/17 1246 11/25/17 0514  NA 129* 133*  K 5.9* 5.1  CL 95* 99  CO2 26 26  GLUCOSE 259* 202*  BUN 44* 46*  CREATININE 1.98* 1.66*  CALCIUM 8.3* 8.7*   PT/INR Recent Labs    11/23/17 0652  LABPROT 15.6*  INR 1.25   ABG No results for input(s): PHART, HCO3 in the last 72 hours.  Invalid input(s): PCO2, PO2  Studies/Results: Dg Chest Port 1 View  Result Date: 11/24/2017 CLINICAL DATA:  Follow-up pleural effusions. EXAM: PORTABLE CHEST 1 VIEW COMPARISON:  Chest x-ray 11/07/2017. Visualized lung bases on CT abdomen and pelvis 11/06/2017. FINDINGS: Cardiac silhouette moderately enlarged for AP portable technique. LEFT subclavian pacing defibrillator. Thoracic aorta atherosclerotic. Pulmonary venous hypertension without overt pulmonary edema. Moderate-sized LEFT pleural effusion, recurrent after the prior thoracentesis. Dense consolidation in the LEFT LOWER LOBE with worsening aeration since the prior exam. Small RIGHT pleural effusion, unchanged. IMPRESSION: 1. Recurrent LEFT pleural effusion. Stable small RIGHT pleural effusion. 2. Passive atelectasis and/or pneumonia involving the LEFT LOWER LOBE. 3. Stable cardiomegaly. Pulmonary venous hypertension without overt edema. Electronically Signed   By: Evangeline Dakin M.D.   On: 11/24/2017 11:14   Dg C-arm 1-60 Min-no Report  Result Date: 11/23/2017 Fluoroscopy was utilized by the requesting physician.  No radiographic interpretation.   Labs and Echocardiogram reviewed.   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   Muscle was free of tumor.   He will potentially  need a restaging resection in 2-3 weeks to confirm a complete resection and then will need BCG induction and subsequent maintenance therapy.  Hyperkalemia with AKI and hyponatremia.  Resolving under medical management.    He is ok to discharge from a urologic standpoint but I will await medical recommendations particularly on discharge medication.        LOS: 1 day    Irine Seal 11/25/2017 [phone] ID: [name], male   DOB: [birth year], 67 y.o.   MRN: [id number]

## 2017-11-25 NOTE — Progress Notes (Signed)
Inpatient Diabetes Program Recommendations  AACE/ADA: New Consensus Statement on Inpatient Glycemic Control (2015)  Target Ranges:  Prepandial:   less than 140 mg/dL      Peak postprandial:   less than 180 mg/dL (1-2 hours)      Critically ill patients:  140 - 180 mg/dL    Results for MANFRED, LASPINA (MRN [id number]) as of 11/25/2017 11:58  Ref. Range 11/25/2017 07:41 11/25/2017 11:33  Glucose-Capillary Latest Ref Range: 70 - 99 mg/dL 184 (H) 206 (H)    Home DM Meds: Lantus 38 units QHS       Novolog 10 units with Breakfast/ 15 units Lunch/ 12 units Dinner       Metformin 1000 mg BID  Current Insulin Orders: Lantus 20 units QHS      Novolog Moderate Correction Scale/ SSI (0-15 units) TID AC       Novolog 6 units TID with meals      MD- Please consider the following in-hospital insulin adjustments:  1. Increase Lantus to 25 units QHS  2. Increase Novolog Meal Coverage to: Novolog 8 units TID with meals (Please add the following Hold Parameters: Hold if pt eats <50% of meal, Hold if pt NPO)     --Will follow patient during hospitalization--  Wyn Quaker RN, MSN, CDE Diabetes Coordinator Inpatient Glycemic Control Team Team Pager: [phone] (8a-5p)

## 2017-11-26 DIAGNOSIS — C672 Malignant neoplasm of lateral wall of bladder: Secondary | ICD-10-CM

## 2017-11-26 LAB — BASIC METABOLIC PANEL
Anion gap: 7 (ref 5–15)
BUN: 43 mg/dL — AB (ref 8–23)
CO2: 30 mmol/L (ref 22–32)
CREATININE: 1.54 mg/dL — AB (ref 0.61–1.24)
Calcium: 9 mg/dL (ref 8.9–10.3)
Chloride: 102 mmol/L (ref 98–111)
GFR, EST AFRICAN AMERICAN: 52 mL/min — AB (ref >60)
GFR, EST NON AFRICAN AMERICAN: 45 mL/min — AB (ref >60)
Glucose, Bld: 158 mg/dL — ABNORMAL HIGH (ref 70–99)
POTASSIUM: 4.8 mmol/L (ref 3.5–5.1)
SODIUM: 139 mmol/L (ref 135–145)

## 2017-11-26 LAB — CBC
HCT: 28.4 % — ABNORMAL LOW (ref 39.0–52.0)
Hemoglobin: 9 g/dL — ABNORMAL LOW (ref 13.0–17.0)
MCH: 27.5 pg (ref 26.0–34.0)
MCHC: 31.7 g/dL (ref 30.0–36.0)
MCV: 86.9 fL (ref 78.0–100.0)
PLATELETS: 147 10*3/uL — AB (ref 150–400)
RBC: 3.27 MIL/uL — ABNORMAL LOW (ref 4.22–5.81)
RDW: 14.3 % (ref 11.5–15.5)
WBC: 7.3 10*3/uL (ref 4.0–10.5)

## 2017-11-26 LAB — GLUCOSE, CAPILLARY
GLUCOSE-CAPILLARY: 151 mg/dL — AB (ref 70–99)
GLUCOSE-CAPILLARY: 198 mg/dL — AB (ref 70–99)

## 2017-11-26 MED ORDER — DIGOXIN 125 MCG PO TABS: 0.1250 mg | ORAL_TABLET | Freq: Every day | ORAL | Status: DC

## 2017-11-26 MED ORDER — VALSARTAN 80 MG PO TABS: 40.0000 mg | ORAL_TABLET | Freq: Two times a day (BID) | ORAL | Status: AC

## 2017-11-26 MED ORDER — SPIRONOLACTONE 25 MG PO TABS: 25.0000 mg | ORAL_TABLET | Freq: Every day | ORAL | Status: AC

## 2017-11-26 MED ORDER — WARFARIN SODIUM 5 MG PO TABS: 5.0000 mg | ORAL_TABLET | Freq: Every day | ORAL | Status: AC

## 2017-11-26 MED ORDER — INSULIN GLARGINE 100 UNIT/ML ~~LOC~~ SOLN: 24.0000 [IU] | Freq: Every day | SUBCUTANEOUS | 0 refills | Status: AC

## 2017-11-26 NOTE — Discharge Summary (Signed)
Physician Discharge Summary  Patient ID: Nathaniel Adams MRN: [id number] DOB/AGE: [birth year] 67 y.o.  Admit date: 11/23/2017 Discharge date: 11/26/2017  Admission Diagnoses:  Bladder cancer [facility])  Discharge Diagnoses:  Principal Problem:   Bladder cancer ([place]) Active Problems:   DM (diabetes mellitus), type 2 ([place])   COPD (chronic obstructive pulmonary disease) (HCC)   AKI (acute kidney injury) ([place])   A-fib (HCC)   Hyperkalemia   Pleural effusion   Past Medical History:  Diagnosis Date  . Bladder mass    left wall bladder mass   . CAD (coronary artery disease)   . COPD (chronic obstructive pulmonary disease) ([place])   . Diabetes ([place])    type 2   . Hx of pleural effusion 11/2017   s/p thoracentesis  at [facility] ; 11-19-17 reports no orthopnea, no coughing, no chest pain , no SOB with exertion , 02 resting 100%  . Myocardial infarction ([place]) 2006   blood clot caused heart attack   . OSA on CPAP 08/16/2017   diagnose 08/16/17  . Peripheral vascular disease ([place]) 2006   hx of blood clot causing MI     Surgeries: Procedure(s): TRANSURETHRAL RESECTION OF BLADDER TUMOR WITH EPIRUBASIN CYSTOSCOPY WITH LEFT RETROGRADE PYELOGRAM on 11/23/2017   Consultants (if any):   Discharged Condition: Improved  Hospital Course: Nathaniel Adams is an 67 y.o. male who was admitted 11/23/2017 with a diagnosis of Bladder cancer ([place]) and went to the operating room on 11/23/2017 and underwent the above named procedures.  He tolerated the surgery well but was noted to be oliguric with AKI and hyperkalemia and hyponatremia post op.  He had some post op bleeding requiring irrigation but it was minimal. Details of his stay are noted below.   He was found to have a T1 HG Non-muscle invasive bladder cancer.  He recovered from his metabolic abnormalities under the care of the hospitalist service and was ready for discharge on 7/26 voiding well with clear urine.   He will require a restaging TURBT  in 2-3 weeks.   Hx: Patient 67 year old gentleman history of coronary artery disease status post stent 2006 at [facility], status post AICD 2007, A. fib on chronic Coumadin therapy and diabetes recent finding of incidental bladder mass.  Patient was admitted at [facility] 11/06/2017 to 11/07/2017 secondary to GI blood felt likely secondary to post polypectomy bleed.  CT done showed liver lesions, pleural effusion and a 3.8 cm mass in the bladder concerning for cancer.  Patient admitted to the urology service on 11/23/2017 underwent cystoscopy with left retrograde pyelogram, TURP of bladder mass and instillation of gemcitabine without any complications.  [facility] hospitalist were consulted for hyperkalemia with a potassium of 6.8, acute on chronic kidney disease.   Assessment & Plan:   Principal Problem:   Hyperkalemia Active Problems:   DM (diabetes mellitus), type 2 (HCC)   COPD (chronic obstructive pulmonary disease) (HCC)   Bladder cancer (HCC)   AKI (acute kidney injury) ([place])   A-fib (HCC)   Pleural effusion  #1 hyperkalemia/acute kidney injury on chronic kidney disease stage II/III Creatinine noted to be as high as 2.02 with a baseline creatinine of 1.32 from last lab work from 11/19/2017.  Potassium noted to be as high as 6.8.  EKG which was done showed T wave inversion in leads II, III, aVF, V5 and V6 with no peak T waves.  Patient status post D50, insulin, IV fluids, bicarb, calcium gluconate.  Potassium improving currently  at 4.8. Patient also noted to have been on Lasix, Aldactone, digoxin, Avapro and metformin.  Patient's Lasix, Aldactone, Avapro, digoxin and metformin were held.  Lasix resumed the evening of 11/25/2017.  IV fluids have been saline locked.  Digoxin resumed.  Aldactone and ARB to be resumed in about 4 to 5 days on follow-up with his cardiologist.   2.  Bladder cancer Status post TURBT with instillation of gemcitabine.  Coumadin on hold since colonoscopy and  aspirin on hold since post polypectomy 11/06/2017.  Per primary team, patient to hold Coumadin for 5 days.  3.  Moderate-sized left pleural effusion Status post thoracentesis 11/07/2017.  Cytology negative for cancer and effusion consistent with a transudate of effusion patient with history of CHF -Status post AICD on diuretics.  2D echo done with a EF of 25 to 30%, moderately dilated left atrium, trivial pericardial effusion identified, elevated ventricular end-diastolic filling pressures and elevated left atrial filling pressures noted.  Patient's diuretics, Aldactone, digoxin, Avapro were held due to hyperkalemia and acute on chronic kidney disease stage III.  Patient with no chest pain.  Patient with some lower extremity edema.  Lasix resumed 11/25/2017.  Resume digoxin. Continue to hold Aldactone, Avapro and could likely resume dose in 4-5 days on follow-up with his cardiologist.  4.  Coronary artery disease/status post AICD Patient follows up with Dr. Layne Benton, [place], [place].  Patient status post stenting 2006.  EKG shows T wave inversions inferior and lateral leads with no prior EKG to compare with.  Patient denies any chest pain.  2D echo done with a EF of 25 to 30%.  Patient status post AICD.  Aspirin and Coumadin on hold secondary to recent post polypectomy bleed, hematuria and bladder cancer.  ARB, Aldactone, digoxin, Lasix were held, due to acute renal failure and hyperkalemia.  Lasix was resumed on 11/25/2017.  Resume digoxin.  Will resume Aldactone and Avapro in the next 4 to 5 days in the outpatient setting.  Patient will need to follow-up with his cardiologist early next week.   5.  Atrial fibrillation CHA2DS2VASC score at least 3.  Digoxin has been resumed.  Continue Coreg for rate control.  Continue to hold Coumadin per urology for the next 5 days as patient presented with hematuria, bladder cancer status post TURBT.  Per primary team to determine resumption of anticoagulation.    6.  Diabetes mellitus type 2 CBG this morning was 151.  Continue Lantus and NovoLog sliding scale insulin.  Outpatient follow-up.      He was given perioperative antibiotics:  Anti-infectives (From admission, onward)   Start     Dose/Rate Route Frequency Ordered Stop   11/23/17 0655  ceFAZolin (ANCEF) 2-4 GM/100ML-% IVPB    Note to Pharmacy:  Randa Evens  : cabinet override      11/23/17 0655 11/23/17 0911   11/23/17 0649  ceFAZolin (ANCEF) IVPB 2g/100 mL premix     2 g 200 mL/hr over 30 Minutes Intravenous 30 min pre-op 11/23/17 0649 11/23/17 0921    .  He was given sequential compression devicesfor DVT prophylaxis.  He benefited maximally from the hospital stay and there were no complications.    Recent vital signs:  Vitals:   11/25/17 2300 11/26/17 0433  BP: 115/62 114/69  Pulse: 72 66  Resp: 16 18  Temp: 98.1 F (36.7 C) 97.6 F (36.4 C)  SpO2: 97% 97%    Recent laboratory studies:  Lab Results  Component Value Date   HGB  9.0 (L) 11/26/2017   HGB 10.0 (L) 11/19/2017   HGB 9.8 (L) 11/07/2017   Lab Results  Component Value Date   WBC 7.3 11/26/2017   PLT 147 (L) 11/26/2017   Lab Results  Component Value Date   INR 1.25 11/23/2017   Lab Results  Component Value Date   NA 139 11/26/2017   K 4.8 11/26/2017   CL 102 11/26/2017   CO2 30 11/26/2017   BUN 43 (H) 11/26/2017   CREATININE 1.54 (H) 11/26/2017   GLUCOSE 158 (H) 11/26/2017    Discharge Medications:   Allergies as of 11/26/2017      Reactions   Ace Inhibitors    Pt states "cough"      Medication List    TAKE these medications   acetaminophen 500 MG tablet Commonly known as:  TYLENOL Take 1,000 mg by mouth every 8 (eight) hours as needed for mild pain or headache.   atorvastatin 80 MG tablet Commonly known as:  LIPITOR Take 40 mg by mouth daily at 6 PM.   carvedilol 25 MG tablet Commonly known as:  COREG Take 37.5 mg by mouth 2 (two) times daily with a meal.    cholecalciferol 1000 units tablet Commonly known as:  VITAMIN D Take 1,000 Units by mouth daily.   digoxin 0.125 MG tablet Commonly known as:  LANOXIN Take 0.125 mg by mouth daily.   fluticasone 50 MCG/ACT nasal spray Commonly known as:  FLONASE Place 2 sprays into both nostrils daily.   furosemide 20 MG tablet Commonly known as:  LASIX Take 20 mg by mouth daily.   insulin aspart 100 UNIT/ML injection Commonly known as:  novoLOG Inject 10-15 Units into the skin See admin instructions. Taking 10 units in the AM before Breakfast, 15 units at Lunch and 12 units in the PM.   insulin glargine 100 UNIT/ML injection Commonly known as:  LANTUS Inject 0.24 mLs (24 Units total) into the skin at bedtime. What changed:  how much to take   loratadine 10 MG tablet Commonly known as:  CLARITIN Take 10 mg by mouth daily.   metFORMIN 1000 MG tablet Commonly known as:  GLUCOPHAGE Take 1,000 mg by mouth 2 (two) times daily with a meal.   spironolactone 25 MG tablet Commonly known as:  ALDACTONE Take 1 tablet (25 mg total) by mouth daily. Start taking on:  11/29/2017 What changed:  These instructions start on 11/29/2017. If you are unsure what to do until then, ask your doctor or other care provider.   traMADol 50 MG tablet Commonly known as:  ULTRAM Take 1 tablet (50 mg total) by mouth every 6 (six) hours as needed.   valsartan 80 MG tablet Commonly known as:  DIOVAN Take 0.5 tablets (40 mg total) by mouth 2 (two) times daily. Start taking on:  11/30/2017 What changed:  These instructions start on 11/30/2017. If you are unsure what to do until then, ask your doctor or other care provider.   warfarin 5 MG tablet Commonly known as:  COUMADIN Take 1-1.5 tablets (5-7.5 mg total) by mouth daily. Take one tablet by mouth once daily except take one an one-half tablets Wednesday Start taking on:  12/01/2017 What changed:  These instructions start on 12/01/2017. If you are unsure what to do  until then, ask your doctor or other care provider.       Diagnostic Studies: Dg Chest 1 View  Result Date: 11/07/2017 CLINICAL DATA:  Post left-sided thoracentesis. EXAM: CHEST  1  VIEW COMPARISON:  CT abdomen and pelvis - 11/06/2017 FINDINGS: Grossly unchanged cardiac silhouette and mediastinal contours. Stable positioning of support apparatus. Interval reduction/resolution of left-sided pleural effusion post thoracentesis. Unchanged small right-sided pleural effusion. Persistent bibasilar heterogeneous/consolidative opacities. Pulmonary vasculature remains indistinct with cephalization of flow. No pneumothorax. No acute osseus abnormalities. IMPRESSION: 1. Interval reduction/resolution of left-sided pleural effusion post thoracentesis. No pneumothorax. 2. Similar findings suggestive of pulmonary edema with small residual right-sided effusion and associated bibasilar opacities, likely atelectasis. Electronically Signed   By: Sandi Mariscal M.D.   On: 11/07/2017 10:29   Dg Chest Port 1 View  Result Date: 11/24/2017 CLINICAL DATA:  Follow-up pleural effusions. EXAM: PORTABLE CHEST 1 VIEW COMPARISON:  Chest x-ray 11/07/2017. Visualized lung bases on CT abdomen and pelvis 11/06/2017. FINDINGS: Cardiac silhouette moderately enlarged for AP portable technique. LEFT subclavian pacing defibrillator. Thoracic aorta atherosclerotic. Pulmonary venous hypertension without overt pulmonary edema. Moderate-sized LEFT pleural effusion, recurrent after the prior thoracentesis. Dense consolidation in the LEFT LOWER LOBE with worsening aeration since the prior exam. Small RIGHT pleural effusion, unchanged. IMPRESSION: 1. Recurrent LEFT pleural effusion. Stable small RIGHT pleural effusion. 2. Passive atelectasis and/or pneumonia involving the LEFT LOWER LOBE. 3. Stable cardiomegaly. Pulmonary venous hypertension without overt edema. Electronically Signed   By: Evangeline Dakin M.D.   On: 11/24/2017 11:14   Dg C-arm 1-60  Min-no Report  Result Date: 11/23/2017 Fluoroscopy was utilized by the requesting physician.  No radiographic interpretation.   US Thoracentesis Asp Pleural Space W/img Guide  Result Date: 11/07/2017 INDICATION: Patient with history of GI bleed, coronary artery disease, bladder mass, liver lesions, bilateral pleural effusions left greater than right. Request made for diagnostic and therapeutic left thoracentesis. EXAM: ULTRASOUND GUIDED DIAGNOSTIC AND THERAPEUTIC LEFT THORACENTESIS MEDICATIONS: None COMPLICATIONS: None immediate. PROCEDURE: An ultrasound guided thoracentesis was thoroughly discussed with the patient and questions answered. The benefits, risks, alternatives and complications were also discussed. The patient understands and wishes to proceed with the procedure. Written consent was obtained. Ultrasound was performed to localize and mark an adequate pocket of fluid in the left chest. The area was then prepped and draped in the normal sterile fashion. 1% Lidocaine was used for local anesthesia. Under ultrasound guidance a 6 Fr Safe-T-Centesis catheter was introduced. Thoracentesis was performed. The catheter was removed and a dressing applied. FINDINGS: A total of approximately 1.2 liters of hazy, amber fluid was removed. Samples were sent to the laboratory as requested by the clinical team. IMPRESSION: Successful ultrasound guided diagnostic and therapeutic left thoracentesis yielding 1.2 liters of pleural fluid. Read by: Rowe Robert, PA-C Electronically Signed   By: Sandi Mariscal M.D.   On: 11/07/2017 10:09    Disposition: Discharge disposition: 01-Home or Self Care       Discharge Instructions    Discontinue IV   Complete by:  As directed       Follow-up Information    Irine Seal, MD On 12/08/2017.   Specialty:  Urology Why:  [place] information: [address] [place] [address] [phone]        CARDIOLOGIST Follow up on 11/29/2017.   Why:  F/U WITH YOUR  CARDIOLOGIST           Signed: Irine Seal 11/26/2017, 2:56 PM

## 2017-11-26 NOTE — Progress Notes (Signed)
3 Days Post-Op  Subjective: Nathaniel Adams is doing well without complaints.  Urine is clear.  Labs have improved.   ROS:  Review of Systems  Constitutional: Negative for fever.  Respiratory: Negative for shortness of breath.   Cardiovascular: Negative for chest pain.  Gastrointestinal: Negative for abdominal pain.  Genitourinary: Negative for hematuria.    Anti-infectives: Anti-infectives (From admission, onward)   Start     Dose/Rate Route Frequency Ordered Stop   11/23/17 0655  ceFAZolin (ANCEF) 2-4 GM/100ML-% IVPB    Note to Pharmacy:  Randa Evens  : cabinet override      11/23/17 0655 11/23/17 0911   11/23/17 0649  ceFAZolin (ANCEF) IVPB 2g/100 mL premix     2 g 200 mL/hr over 30 Minutes Intravenous 30 min pre-op 11/23/17 2694 11/23/17 0921      Current Facility-Administered Medications  Medication Dose Route Frequency Provider Last Rate Last Dose  . 0.9 %  sodium chloride infusion  250 mL Intravenous PRN Irine Seal, MD      . acetaminophen (TYLENOL) tablet 650 mg  650 mg Oral Q4H PRN Irine Seal, MD       Or  . acetaminophen (TYLENOL) suppository 650 mg  650 mg Rectal Q4H PRN Irine Seal, MD      . atorvastatin (LIPITOR) tablet 40 mg  40 mg Oral q1800 Irine Seal, MD   40 mg at 11/25/17 1702  . bisacodyl (DULCOLAX) suppository 10 mg  10 mg Rectal Daily PRN Irine Seal, MD      . carvedilol (COREG) tablet 37.5 mg  37.5 mg Oral BID WC Irine Seal, MD   37.5 mg at 11/25/17 1702  . diphenhydrAMINE (BENADRYL) injection 12.5 mg  12.5 mg Intravenous Q6H PRN Irine Seal, MD       Or  . diphenhydrAMINE (BENADRYL) 12.5 MG/5ML elixir 12.5 mg  12.5 mg Oral Q6H PRN Irine Seal, MD      . fluticasone Asencion Islam) 50 MCG/ACT nasal spray 2 spray  2 spray Each Nare Daily Irine Seal, MD   2 spray at 11/25/17 8546  . furosemide (LASIX) tablet 20 mg  20 mg Oral Daily Eugenie Filler, MD   20 mg at 11/25/17 1848  . HYDROcodone-acetaminophen (NORCO/VICODIN) 5-325 MG per tablet 1-2 tablet   1-2 tablet Oral Q4H PRN Irine Seal, MD   1 tablet at 11/23/17 1438  . insulin aspart (novoLOG) injection 0-15 Units  0-15 Units Subcutaneous TID WC Irine Seal, MD   2 Units at 11/25/17 1701  . insulin aspart (novoLOG) injection 6 Units  6 Units Subcutaneous TID WC Debbe Odea, MD   6 Units at 11/25/17 1701  . insulin glargine (LANTUS) injection 20 Units  20 Units Subcutaneous QHS Debbe Odea, MD   20 Units at 11/25/17 2150  . loratadine (CLARITIN) tablet 10 mg  10 mg Oral Daily Irine Seal, MD   10 mg at 11/25/17 2703  . morphine 2 MG/ML injection 2 mg  2 mg Intravenous Q2H PRN Irine Seal, MD   2 mg at 11/23/17 2046  . ondansetron (ZOFRAN) injection 4 mg  4 mg Intravenous Q4H PRN Irine Seal, MD      . opium-belladonna (B&O SUPPRETTES) 16.2-60 MG suppository 1 suppository  1 suppository Rectal Q6H PRN Irine Seal, MD   1 suppository at 11/24/17 0302  . oxyCODONE (Oxy IR/ROXICODONE) immediate release tablet 5-10 mg  5-10 mg Oral Q4H PRN Irine Seal, MD      . senna-docusate (Senokot-S) tablet 1 tablet  1 tablet Oral QHS PRN Irine Seal, MD      . sodium chloride flush (NS) 0.9 % injection 3 mL  3 mL Intravenous Q12H Irine Seal, MD   3 mL at 11/25/17 2151  . sodium chloride flush (NS) 0.9 % injection 3 mL  3 mL Intravenous PRN Irine Seal, MD      . sodium phosphate (FLEET) 7-19 GM/118ML enema 1 enema  1 enema Rectal Once PRN Irine Seal, MD      . zolpidem (AMBIEN) tablet 5 mg  5 mg Oral QHS PRN Irine Seal, MD         Objective: Vital signs in last 24 hours: Temp:  [97.6 F (36.4 C)-98.1 F (36.7 C)] 97.6 F (36.4 C) (07/26 0433) Pulse Rate:  [66-75] 66 (07/26 0433) Resp:  [16-18] 18 (07/26 0433) BP: (114-124)/(62-71) 114/69 (07/26 0433) SpO2:  [97 %] 97 % (07/26 0433) Weight:  [231 lb 4.8 oz (104.9 kg)] 231 lb 4.8 oz (104.9 kg) (07/26 0433)  Intake/Output from previous day: 07/25 0701 - 07/26 0700 In: 240 [P.O.:240] Out: 1800 [Urine:1800] Intake/Output this shift: No  intake/output data recorded.   Physical Exam  Constitutional: He appears well-developed and well-nourished.  Vitals reviewed.   Lab Results:  Recent Labs    11/26/17 0443  WBC 7.3  HGB 9.0*  HCT 28.4*  PLT 147*   BMET Recent Labs    11/25/17 0514 11/26/17 0443  NA 133* 139  K 5.1 4.8  CL 99 102  CO2 26 30  GLUCOSE 202* 158*  BUN 46* 43*  CREATININE 1.66* 1.54*  CALCIUM 8.7* 9.0   PT/INR No results for input(s): LABPROT, INR in the last 72 hours. ABG No results for input(s): PHART, HCO3 in the last 72 hours.  Invalid input(s): PCO2, PO2  Studies/Results: Dg Chest Port 1 View  Result Date: 11/24/2017 CLINICAL DATA:  Follow-up pleural effusions. EXAM: PORTABLE CHEST 1 VIEW COMPARISON:  Chest x-ray 11/07/2017. Visualized lung bases on CT abdomen and pelvis 11/06/2017. FINDINGS: Cardiac silhouette moderately enlarged for AP portable technique. LEFT subclavian pacing defibrillator. Thoracic aorta atherosclerotic. Pulmonary venous hypertension without overt pulmonary edema. Moderate-sized LEFT pleural effusion, recurrent after the prior thoracentesis. Dense consolidation in the LEFT LOWER LOBE with worsening aeration since the prior exam. Small RIGHT pleural effusion, unchanged. IMPRESSION: 1. Recurrent LEFT pleural effusion. Stable small RIGHT pleural effusion. 2. Passive atelectasis and/or pneumonia involving the LEFT LOWER LOBE. 3. Stable cardiomegaly. Pulmonary venous hypertension without overt edema. Electronically Signed   By: Evangeline Dakin M.D.   On: 11/24/2017 11:14     Assessment and Plan: High Grade T1 Non-muscle invasive bladder cancer s/p TURBT.   He should be ok for discharge today but I will await final recommendations from Dr. Grandville Silos.   He will need a restaging TURBT in 2-3 weeks and we are working on that.    Afib with CHF and an EF fo 25-30%.   He will hold the warfarin for 5 days before resuming but it will need to be held again per routine for the next  procedure.  AKI with hypercalemia and hyponatremia.  Cr is back to baseline and K and Na have normalized.   Anemia.  Hgb is slightly down at 9.0 but was 9.9 preop.     LOS: 2 days    Irine Seal 11/26/2017 [phone]

## 2017-11-26 NOTE — Care Management Important Message (Signed)
Important Message  Patient Details  Name: Nathaniel Adams MRN: [id number] Date of Birth: March 03, 1951   Medicare Important Message Given:  Yes    Kerin Salen 11/26/2017, 10:47 AMImportant Message  Patient Details  Name: Nathaniel Adams MRN: [id number] Date of Birth: [birth year]   Medicare Important Message Given:  Yes    Kerin Salen 11/26/2017, 10:47 AM

## 2017-11-26 NOTE — Progress Notes (Signed)
Consult/PROGRESS NOTE    Nathaniel Adams  OMV:[id number] DOB: [birth year] DOA: 11/23/2017 PCP: System, Pcp Not In   Brief Narrative:  Patient 67 year old gentleman history of coronary artery disease status post stent 2006 at [facility], status post AICD 2007, A. fib on chronic Coumadin therapy and diabetes recent finding of incidental bladder mass.  Patient was admitted at [facility] 11/06/2017 to 11/07/2017 secondary to GI blood felt likely secondary to post polypectomy bleed.  CT done showed liver lesions, pleural effusion and a 3.8 cm mass in the bladder concerning for cancer.  Patient admitted to the urology service on 11/23/2017 underwent cystoscopy with left retrograde pyelogram, TURP of bladder mass and instillation of gemcitabine without any complications.  [facility] were consulted for hyperkalemia with a potassium of 6.8, acute on chronic kidney disease.   Assessment & Plan:   Principal Problem:   Hyperkalemia Active Problems:   DM (diabetes mellitus), type 2 (HCC)   COPD (chronic obstructive pulmonary disease) (HCC)   Bladder cancer (HCC)   AKI (acute kidney injury) ([place])   A-fib (HCC)   Pleural effusion  #1 hyperkalemia/acute kidney injury on chronic kidney disease stage II/III Creatinine noted to be as high as 2.02 with a baseline creatinine of 1.32 from last lab work from 11/19/2017.  Potassium noted to be as high as 6.8.  EKG which was done showed T wave inversion in leads II, III, aVF, V5 and V6 with no peak T waves.  Patient status post D50, insulin, IV fluids, bicarb, calcium gluconate.  Potassium improving currently at 4.8. Patient also noted to have been on Lasix, Aldactone, digoxin, Avapro and metformin.  Patient's Lasix, Aldactone, Avapro, digoxin and metformin were held.  Lasix resumed the evening of 11/25/2017.  IV fluids have been saline locked.  Digoxin resumed.  Aldactone and ARB to be resumed in about 4 to 5 days on follow-up with his cardiologist.     2.  Bladder cancer Status post TURBT with instillation of gemcitabine.  Coumadin on hold since colonoscopy and aspirin on hold since post polypectomy 11/06/2017.  Per primary team, patient to hold Coumadin for 5 days.  3.  Moderate-sized left pleural effusion Status post thoracentesis 11/07/2017.  Cytology negative for cancer and effusion consistent with a transudate of effusion patient with history of CHF -Status post AICD on diuretics.  2D echo done with a EF of 25 to 30%, moderately dilated left atrium, trivial pericardial effusion identified, elevated ventricular end-diastolic filling pressures and elevated left atrial filling pressures noted.  Patient's diuretics, Aldactone, digoxin, Avapro were held due to hyperkalemia and acute on chronic kidney disease stage III.  Patient with no chest pain.  Patient with some lower extremity edema.  Lasix resumed 11/25/2017.  Resume digoxin. Continue to hold Aldactone, Avapro and could likely resume dose in 4-5 days on follow-up with his cardiologist.  4.  Coronary artery disease/status post AICD Patient follows up with Dr. Layne Benton, [place], [place].  Patient status post stenting 2006.  EKG shows T wave inversions inferior and lateral leads with no prior EKG to compare with.  Patient denies any chest pain.  2D echo done with a EF of 25 to 30%.  Patient status post AICD.  Aspirin and Coumadin on hold secondary to recent post polypectomy bleed, hematuria and bladder cancer.  ARB, Aldactone, digoxin, Lasix were held, due to acute renal failure and hyperkalemia.  Lasix was resumed on 11/25/2017.  Resume digoxin.  Will resume Aldactone and Avapro in the next 4  to 5 days in the outpatient setting.  Patient will need to follow-up with his cardiologist early next week.   5.  Atrial fibrillation CHA2DS2VASC score at least 3.  Digoxin has been resumed.  Continue Coreg for rate control.  Continue to hold Coumadin per urology for the next 5 days as patient presented  with hematuria, bladder cancer status post TURBT.  Per primary team to determine resumption of anticoagulation.   6.  Diabetes mellitus type 2 CBG this morning was 151.  Continue Lantus and NovoLog sliding scale insulin.  Outpatient follow-up.    DVT prophylaxis: SCDs Code Status: Full Family Communication: Updated patient and wife at bedside. Disposition Plan: Medically stable to be discharged.    Consultants:   [facility] hospitalist: Dr. Wynelle Cleveland 11/24/2017  Procedures:   Chest x-ray 11/24/2017  2D echo 11/24/2017  Antimicrobials:   None   Subjective: Patient denies any chest pain.  No shortness of breath.  Hematuria improving.   Objective: Vitals:   11/25/17 0514 11/25/17 1218 11/25/17 2300 11/26/17 0433  BP: 136/73 124/71 115/62 114/69  Pulse: 79 75 72 66  Resp: 20 18 16 18   Temp: (!) 97.4 F (36.3 C) 98.1 F (36.7 C) 98.1 F (36.7 C) 97.6 F (36.4 C)  TempSrc: Oral Oral Oral   SpO2: 100% 97% 97% 97%  Weight:    104.9 kg (231 lb 4.8 oz)  Height:        Intake/Output Summary (Last 24 hours) at 11/26/2017 0952 Last data filed at 11/26/2017 4034 Gross per 24 hour  Intake 360 ml  Output 1800 ml  Net -1440 ml   Filed Weights   11/23/17 0709 11/26/17 0433  Weight: 103.9 kg (229 lb) 104.9 kg (231 lb 4.8 oz)    Examination:  General exam: NAD Respiratory system: Breath sounds decreased in the bases.  No rhonchi.  Respiratory effort normal. Cardiovascular system: RRR no murmurs rubs or gallops.  No JVD.  1+ bilateral lower extremity edema.  Gastrointestinal system: Abdomen is nontender, nondistended, soft, positive bowel sounds.  No hepatosplenomegaly.  Central nervous system: Alert and oriented. No focal neurological deficits. Extremities: Symmetric 5 x 5 power. Skin: No rashes, lesions or ulcers Psychiatry: Judgement and insight appear normal. Mood & affect appropriate.     Data Reviewed: I have personally reviewed following labs and imaging  studies  CBC: Recent Labs  Lab 11/19/17 1038 11/26/17 0443  WBC 6.1 7.3  HGB 10.0* 9.0*  HCT 31.4* 28.4*  MCV 88.2 86.9  PLT 191 742*   Basic Metabolic Panel: Recent Labs  Lab 11/24/17 0505 11/24/17 0750 11/24/17 1246 11/25/17 0514 11/26/17 0443  NA 131* 131* 129* 133* 139  K 6.8* 6.0* 5.9* 5.1 4.8  CL 96* 97* 95* 99 102  CO2 27 26 26 26 30   GLUCOSE 305* 289* 259* 202* 158*  BUN 39* 43* 44* 46* 43*  CREATININE 2.02* 2.17* 1.98* 1.66* 1.54*  CALCIUM 8.3* 8.4* 8.3* 8.7* 9.0   GFR: Estimated Creatinine Clearance: 54.8 mL/min (A) (by C-G formula based on SCr of 1.54 mg/dL (H)). Liver Function Tests: No results for input(s): AST, ALT, ALKPHOS, BILITOT, PROT, ALBUMIN in the last 168 hours. No results for input(s): LIPASE, AMYLASE in the last 168 hours. No results for input(s): AMMONIA in the last 168 hours. Coagulation Profile: Recent Labs  Lab 11/19/17 1048 11/23/17 0652  INR 1.92 1.25   Cardiac Enzymes: No results for input(s): CKTOTAL, CKMB, CKMBINDEX, TROPONINI in the last 168 hours. BNP (last 3 results)  No results for input(s): PROBNP in the last 8760 hours. HbA1C: No results for input(s): HGBA1C in the last 72 hours. CBG: Recent Labs  Lab 11/25/17 0741 11/25/17 1133 11/25/17 1652 11/25/17 2125 11/26/17 0727  GLUCAP 184* 206* 133* 185* 151*   Lipid Profile: No results for input(s): CHOL, HDL, LDLCALC, TRIG, CHOLHDL, LDLDIRECT in the last 72 hours. Thyroid Function Tests: No results for input(s): TSH, T4TOTAL, FREET4, T3FREE, THYROIDAB in the last 72 hours. Anemia Panel: No results for input(s): VITAMINB12, FOLATE, FERRITIN, TIBC, IRON, RETICCTPCT in the last 72 hours. Sepsis Labs: No results for input(s): PROCALCITON, LATICACIDVEN in the last 168 hours.  No results found for this or any previous visit (from the past 240 hour(s)).       Radiology Studies: Dg Chest Port 1 View  Result Date: 11/24/2017 CLINICAL DATA:  Follow-up pleural  effusions. EXAM: PORTABLE CHEST 1 VIEW COMPARISON:  Chest x-ray 11/07/2017. Visualized lung bases on CT abdomen and pelvis 11/06/2017. FINDINGS: Cardiac silhouette moderately enlarged for AP portable technique. LEFT subclavian pacing defibrillator. Thoracic aorta atherosclerotic. Pulmonary venous hypertension without overt pulmonary edema. Moderate-sized LEFT pleural effusion, recurrent after the prior thoracentesis. Dense consolidation in the LEFT LOWER LOBE with worsening aeration since the prior exam. Small RIGHT pleural effusion, unchanged. IMPRESSION: 1. Recurrent LEFT pleural effusion. Stable small RIGHT pleural effusion. 2. Passive atelectasis and/or pneumonia involving the LEFT LOWER LOBE. 3. Stable cardiomegaly. Pulmonary venous hypertension without overt edema. Electronically Signed   By: Evangeline Dakin M.D.   On: 11/24/2017 11:14        Scheduled Meds: . atorvastatin  40 mg Oral q1800  . carvedilol  37.5 mg Oral BID WC  . digoxin  0.125 mg Oral Daily  . fluticasone  2 spray Each Nare Daily  . furosemide  20 mg Oral Daily  . insulin aspart  0-15 Units Subcutaneous TID WC  . insulin aspart  6 Units Subcutaneous TID WC  . insulin glargine  20 Units Subcutaneous QHS  . loratadine  10 mg Oral Daily  . sodium chloride flush  3 mL Intravenous Q12H   Continuous Infusions: . sodium chloride       LOS: 2 days    Time spent: 35 mins    Irine Seal, MD [facility] Pager [phone]  If 7PM-7AM, please contact night-coverage [email or web address] Password [email or web address] 11/26/2017, 9:52 AM

## 2017-11-29 ENCOUNTER — Other Ambulatory Visit: Payer: Self-pay | Admitting: Urology

## 2017-12-01 ENCOUNTER — Other Ambulatory Visit: Payer: Self-pay

## 2017-12-01 ENCOUNTER — Encounter (HOSPITAL_COMMUNITY): Payer: Self-pay | Admitting: *Deleted

## 2017-12-02 ENCOUNTER — Other Ambulatory Visit: Payer: Self-pay

## 2017-12-06 NOTE — Anesthesia Preprocedure Evaluation (Addendum)
Anesthesia Evaluation  Patient identified by MRN, date of birth, ID band Patient awake    Reviewed: Allergy & Precautions, NPO status , Patient's Chart, lab work & pertinent test results  Airway Mallampati: II  TM Distance: >3 FB Neck ROM: Full    Dental no notable dental hx.    Pulmonary sleep apnea , COPD,    Pulmonary exam normal breath sounds clear to auscultation       Cardiovascular hypertension, Pt. on medications and Pt. on home beta blockers + CAD, + Past MI, + Cardiac Stents, + Peripheral Vascular Disease and +CHF  Normal cardiovascular exam+ dysrhythmias Atrial Fibrillation + Cardiac Defibrillator  Rhythm:Regular Rate:Normal     Neuro/Psych negative neurological ROS  negative psych ROS   GI/Hepatic negative GI ROS, Neg liver ROS,   Endo/Other  diabetes, Insulin Dependent  Renal/GU Renal InsufficiencyRenal disease  negative genitourinary   Musculoskeletal negative musculoskeletal ROS (+)   Abdominal   Peds negative pediatric ROS (+)  Hematology  (+) anemia ,   Anesthesia Other Findings   Reproductive/Obstetrics negative OB ROS                            Lab Results  Component Value Date   WBC 7.3 11/26/2017   HGB 9.0 (L) 11/26/2017   HCT 28.4 (L) 11/26/2017   MCV 86.9 11/26/2017   PLT 147 (L) 11/26/2017   Lab Results  Component Value Date   CREATININE 1.54 (H) 11/26/2017   BUN 43 (H) 11/26/2017   NA 139 11/26/2017   K 4.8 11/26/2017   CL 102 11/26/2017   CO2 30 11/26/2017   Study Conclusions  - Left ventricle: Poor image quality diffuse hypokinesis septum and apex appeared scarred. The cavity size was moderately dilated.Wall thickness was normal. Systolic function was severely  reduced. The estimated ejection fraction was in the range of 25%  to 30%. Doppler parameters are consistent with both elevated ventricular end-diastolic filling pressure and elevated left  atrial filling pressure. - Mitral valve: Calcified annulus. Mildly thickened leaflets . - Left atrium: The atrium was moderately dilated. - Atrial septum: No defect or patent foramen ovale was identified. - Pericardium, extracardiac: A trivial pericardial effusion was identified. Anesthesia Physical  Anesthesia Plan  ASA: IV  Anesthesia Plan: General   Post-op Pain Management:    Induction: Intravenous  PONV Risk Score and Plan: 2 and Dexamethasone, Ondansetron and Treatment may vary due to age or medical condition  Airway Management Planned: Oral ETT  Additional Equipment:   Intra-op Plan:   Post-operative Plan: Extubation in OR  Informed Consent: I have reviewed the patients History and Physical, chart, labs and discussed the procedure including the risks, benefits and alternatives for the proposed anesthesia with the patient or authorized representative who has indicated his/her understanding and acceptance.   Dental advisory given  Plan Discussed with: CRNA and Surgeon  Anesthesia Plan Comments:         Anesthesia Quick Evaluation

## 2017-12-07 ENCOUNTER — Encounter (HOSPITAL_COMMUNITY): Payer: Self-pay | Admitting: Emergency Medicine

## 2017-12-07 ENCOUNTER — Ambulatory Visit (HOSPITAL_COMMUNITY): Payer: Medicare Other | Admitting: Anesthesiology

## 2017-12-07 ENCOUNTER — Observation Stay (HOSPITAL_COMMUNITY)
Admission: RE | Admit: 2017-12-07 | Discharge: 2017-12-08 | Disposition: A | Discharge status: Home / Self Care | Payer: Medicare Other | Source: Ambulatory Visit | Attending: Urology | Admitting: Urology

## 2017-12-07 ENCOUNTER — Other Ambulatory Visit: Payer: Self-pay

## 2017-12-07 ENCOUNTER — Encounter (HOSPITAL_COMMUNITY)
Admission: RE | Disposition: A | Discharge status: Home / Self Care | Payer: Self-pay | Source: Ambulatory Visit | Attending: Urology

## 2017-12-07 DIAGNOSIS — Z86718 Personal history of other venous thrombosis and embolism: Secondary | ICD-10-CM | POA: Diagnosis not present

## 2017-12-07 DIAGNOSIS — Z955 Presence of coronary angioplasty implant and graft: Secondary | ICD-10-CM | POA: Insufficient documentation

## 2017-12-07 DIAGNOSIS — Z79899 Other long term (current) drug therapy: Secondary | ICD-10-CM | POA: Insufficient documentation

## 2017-12-07 DIAGNOSIS — G4733 Obstructive sleep apnea (adult) (pediatric): Secondary | ICD-10-CM | POA: Insufficient documentation

## 2017-12-07 DIAGNOSIS — I252 Old myocardial infarction: Secondary | ICD-10-CM | POA: Diagnosis not present

## 2017-12-07 DIAGNOSIS — C679 Malignant neoplasm of bladder, unspecified: Secondary | ICD-10-CM | POA: Diagnosis present

## 2017-12-07 DIAGNOSIS — Z888 Allergy status to other drugs, medicaments and biological substances status: Secondary | ICD-10-CM | POA: Insufficient documentation

## 2017-12-07 DIAGNOSIS — I4891 Unspecified atrial fibrillation: Secondary | ICD-10-CM | POA: Diagnosis not present

## 2017-12-07 DIAGNOSIS — K219 Gastro-esophageal reflux disease without esophagitis: Secondary | ICD-10-CM | POA: Insufficient documentation

## 2017-12-07 DIAGNOSIS — I2510 Atherosclerotic heart disease of native coronary artery without angina pectoris: Secondary | ICD-10-CM | POA: Diagnosis not present

## 2017-12-07 DIAGNOSIS — N3289 Other specified disorders of bladder: Secondary | ICD-10-CM | POA: Diagnosis not present

## 2017-12-07 DIAGNOSIS — E1151 Type 2 diabetes mellitus with diabetic peripheral angiopathy without gangrene: Secondary | ICD-10-CM | POA: Insufficient documentation

## 2017-12-07 DIAGNOSIS — E7800 Pure hypercholesterolemia, unspecified: Secondary | ICD-10-CM | POA: Diagnosis not present

## 2017-12-07 DIAGNOSIS — C672 Malignant neoplasm of lateral wall of bladder: Principal | ICD-10-CM | POA: Insufficient documentation

## 2017-12-07 DIAGNOSIS — Z794 Long term (current) use of insulin: Secondary | ICD-10-CM | POA: Diagnosis not present

## 2017-12-07 DIAGNOSIS — Z9989 Dependence on other enabling machines and devices: Secondary | ICD-10-CM | POA: Diagnosis not present

## 2017-12-07 DIAGNOSIS — Z7982 Long term (current) use of aspirin: Secondary | ICD-10-CM | POA: Diagnosis not present

## 2017-12-07 DIAGNOSIS — Z7901 Long term (current) use of anticoagulants: Secondary | ICD-10-CM | POA: Insufficient documentation

## 2017-12-07 DIAGNOSIS — I10 Essential (primary) hypertension: Secondary | ICD-10-CM | POA: Insufficient documentation

## 2017-12-07 DIAGNOSIS — C678 Malignant neoplasm of overlapping sites of bladder: Secondary | ICD-10-CM

## 2017-12-07 DIAGNOSIS — N3000 Acute cystitis without hematuria: Secondary | ICD-10-CM | POA: Diagnosis not present

## 2017-12-07 DIAGNOSIS — J449 Chronic obstructive pulmonary disease, unspecified: Secondary | ICD-10-CM | POA: Insufficient documentation

## 2017-12-07 HISTORY — PX: TRANSURETHRAL RESECTION OF BLADDER TUMOR: SHX2575

## 2017-12-07 LAB — BASIC METABOLIC PANEL
ANION GAP: 8 (ref 5–15)
BUN: 19 mg/dL (ref 8–23)
CHLORIDE: 104 mmol/L (ref 98–111)
CO2: 27 mmol/L (ref 22–32)
Calcium: 8.9 mg/dL (ref 8.9–10.3)
Creatinine, Ser: 1.33 mg/dL — ABNORMAL HIGH (ref 0.61–1.24)
GFR calc non Af Amer: 53 mL/min — ABNORMAL LOW (ref >60)
Glucose, Bld: 158 mg/dL — ABNORMAL HIGH (ref 70–99)
Potassium: 4.7 mmol/L (ref 3.5–5.1)
SODIUM: 139 mmol/L (ref 135–145)

## 2017-12-07 LAB — GLUCOSE, CAPILLARY
GLUCOSE-CAPILLARY: 162 mg/dL — AB (ref 70–99)
GLUCOSE-CAPILLARY: 195 mg/dL — AB (ref 70–99)
Glucose-Capillary: 145 mg/dL — ABNORMAL HIGH (ref 70–99)
Glucose-Capillary: 144 mg/dL — ABNORMAL HIGH (ref 70–99)
Glucose-Capillary: 276 mg/dL — ABNORMAL HIGH (ref 70–99)

## 2017-12-07 LAB — PROTIME-INR
INR: 1.15
Prothrombin Time: 14.7 seconds (ref 11.4–15.2)

## 2017-12-07 SURGERY — TURBT (TRANSURETHRAL RESECTION OF BLADDER TUMOR)
Anesthesia: General

## 2017-12-07 MED ORDER — IRBESARTAN 150 MG PO TABS
75.0000 mg | ORAL_TABLET | Freq: Every day | ORAL | Status: DC
  Administered 2017-12-07 – 2017-12-08 (×2): 75 mg via ORAL
  Filled 2017-12-07 (×2): qty 1

## 2017-12-07 MED ORDER — OXYCODONE HCL 5 MG PO TABS
5.0000 mg | ORAL_TABLET | ORAL | Status: DC | PRN
  Administered 2017-12-07: 5 mg via ORAL
  Filled 2017-12-07: qty 1

## 2017-12-07 MED ORDER — EPHEDRINE 5 MG/ML INJ
INTRAVENOUS | Status: AC
  Filled 2017-12-07: qty 10

## 2017-12-07 MED ORDER — LIDOCAINE 2% (20 MG/ML) 5 ML SYRINGE
INTRAMUSCULAR | Status: DC | PRN
  Administered 2017-12-07: 80 mg via INTRAVENOUS

## 2017-12-07 MED ORDER — FENTANYL CITRATE (PF) 100 MCG/2ML IJ SOLN
INTRAMUSCULAR | Status: DC | PRN
  Administered 2017-12-07 (×2): 25 ug via INTRAVENOUS

## 2017-12-07 MED ORDER — DEXAMETHASONE SODIUM PHOSPHATE 10 MG/ML IJ SOLN
INTRAMUSCULAR | Status: AC
  Filled 2017-12-07: qty 1

## 2017-12-07 MED ORDER — SODIUM CHLORIDE 0.9 % IR SOLN
Status: DC | PRN
  Administered 2017-12-07: 3000 mL

## 2017-12-07 MED ORDER — SODIUM CHLORIDE 0.45 % IV SOLN
INTRAVENOUS | Status: DC
  Administered 2017-12-07 – 2017-12-08 (×3): via INTRAVENOUS

## 2017-12-07 MED ORDER — SUGAMMADEX SODIUM 500 MG/5ML IV SOLN
INTRAVENOUS | Status: AC
  Filled 2017-12-07: qty 5

## 2017-12-07 MED ORDER — HYDROMORPHONE HCL 1 MG/ML IJ SOLN
0.5000 mg | INTRAMUSCULAR | Status: DC | PRN
  Administered 2017-12-07: 1 mg via INTRAVENOUS
  Filled 2017-12-07: qty 1

## 2017-12-07 MED ORDER — LIDOCAINE 2% (20 MG/ML) 5 ML SYRINGE
INTRAMUSCULAR | Status: AC
  Filled 2017-12-07: qty 5

## 2017-12-07 MED ORDER — FLEET ENEMA 7-19 GM/118ML RE ENEM: 1.0000 | ENEMA | Freq: Once | RECTAL | Status: DC | PRN

## 2017-12-07 MED ORDER — METFORMIN HCL 500 MG PO TABS
1000.0000 mg | ORAL_TABLET | Freq: Two times a day (BID) | ORAL | Status: DC
  Administered 2017-12-07 – 2017-12-08 (×2): 1000 mg via ORAL
  Filled 2017-12-07 (×2): qty 2

## 2017-12-07 MED ORDER — ATORVASTATIN CALCIUM 40 MG PO TABS
40.0000 mg | ORAL_TABLET | Freq: Every day | ORAL | Status: DC
  Administered 2017-12-07: 40 mg via ORAL
  Filled 2017-12-07: qty 1

## 2017-12-07 MED ORDER — SPIRONOLACTONE 25 MG PO TABS
25.0000 mg | ORAL_TABLET | Freq: Every day | ORAL | Status: DC
  Administered 2017-12-07 – 2017-12-08 (×2): 25 mg via ORAL
  Filled 2017-12-07 (×2): qty 1

## 2017-12-07 MED ORDER — SENNOSIDES-DOCUSATE SODIUM 8.6-50 MG PO TABS: 1.0000 | ORAL_TABLET | Freq: Every evening | ORAL | Status: DC | PRN

## 2017-12-07 MED ORDER — CEFAZOLIN SODIUM-DEXTROSE 2-4 GM/100ML-% IV SOLN
2.0000 g | INTRAVENOUS | Status: AC
  Administered 2017-12-07: 2 g via INTRAVENOUS
  Filled 2017-12-07: qty 100

## 2017-12-07 MED ORDER — LACTATED RINGERS IV SOLN
INTRAVENOUS | Status: DC
  Administered 2017-12-07: 06:00:00 via INTRAVENOUS

## 2017-12-07 MED ORDER — PHENYLEPHRINE HCL 10 MG/ML IJ SOLN
INTRAMUSCULAR | Status: AC
  Filled 2017-12-07: qty 1

## 2017-12-07 MED ORDER — NON FORMULARY: 0.5000 | Freq: Two times a day (BID) | Status: DC

## 2017-12-07 MED ORDER — ROCURONIUM BROMIDE 10 MG/ML (PF) SYRINGE
PREFILLED_SYRINGE | INTRAVENOUS | Status: AC
  Filled 2017-12-07: qty 10

## 2017-12-07 MED ORDER — DIGOXIN 125 MCG PO TABS
0.1250 mg | ORAL_TABLET | Freq: Every day | ORAL | Status: DC
  Administered 2017-12-08: 0.125 mg via ORAL
  Filled 2017-12-07: qty 1

## 2017-12-07 MED ORDER — LORATADINE 10 MG PO TABS
10.0000 mg | ORAL_TABLET | Freq: Every day | ORAL | Status: DC
  Administered 2017-12-08: 10 mg via ORAL
  Filled 2017-12-07: qty 1

## 2017-12-07 MED ORDER — INSULIN ASPART 100 UNIT/ML ~~LOC~~ SOLN
0.0000 [IU] | Freq: Three times a day (TID) | SUBCUTANEOUS | Status: DC
  Administered 2017-12-07 – 2017-12-08 (×3): 3 [IU] via SUBCUTANEOUS

## 2017-12-07 MED ORDER — ONDANSETRON HCL 4 MG/2ML IJ SOLN: 4.0000 mg | INTRAMUSCULAR | Status: DC | PRN

## 2017-12-07 MED ORDER — CARVEDILOL 25 MG PO TABS
37.5000 mg | ORAL_TABLET | Freq: Two times a day (BID) | ORAL | Status: DC
  Administered 2017-12-07 – 2017-12-08 (×2): 37.5 mg via ORAL
  Filled 2017-12-07 (×2): qty 1

## 2017-12-07 MED ORDER — FLUTICASONE PROPIONATE 50 MCG/ACT NA SUSP
2.0000 | Freq: Every day | NASAL | Status: DC
  Administered 2017-12-07 – 2017-12-08 (×2): 2 via NASAL
  Filled 2017-12-07: qty 16

## 2017-12-07 MED ORDER — FENTANYL CITRATE (PF) 100 MCG/2ML IJ SOLN
INTRAMUSCULAR | Status: AC
  Filled 2017-12-07: qty 2

## 2017-12-07 MED ORDER — MIDAZOLAM HCL 5 MG/5ML IJ SOLN
INTRAMUSCULAR | Status: DC | PRN
  Administered 2017-12-07: 1 mg via INTRAVENOUS

## 2017-12-07 MED ORDER — SUGAMMADEX SODIUM 200 MG/2ML IV SOLN
INTRAVENOUS | Status: DC | PRN
  Administered 2017-12-07: 400 mg via INTRAVENOUS

## 2017-12-07 MED ORDER — BISACODYL 10 MG RE SUPP: 10.0000 mg | Freq: Every day | RECTAL | Status: DC | PRN

## 2017-12-07 MED ORDER — TRAMADOL HCL 50 MG PO TABS: 50.0000 mg | ORAL_TABLET | Freq: Four times a day (QID) | ORAL | Status: DC | PRN

## 2017-12-07 MED ORDER — ONDANSETRON HCL 4 MG/2ML IJ SOLN
INTRAMUSCULAR | Status: DC | PRN
  Administered 2017-12-07: 4 mg via INTRAVENOUS

## 2017-12-07 MED ORDER — SUCCINYLCHOLINE CHLORIDE 200 MG/10ML IV SOSY
PREFILLED_SYRINGE | INTRAVENOUS | Status: AC
  Filled 2017-12-07: qty 10

## 2017-12-07 MED ORDER — FENTANYL CITRATE (PF) 100 MCG/2ML IJ SOLN
25.0000 ug | INTRAMUSCULAR | Status: DC | PRN
  Administered 2017-12-07 (×2): 50 ug via INTRAVENOUS

## 2017-12-07 MED ORDER — PROPOFOL 10 MG/ML IV BOLUS
INTRAVENOUS | Status: DC | PRN
  Administered 2017-12-07: 100 mg via INTRAVENOUS

## 2017-12-07 MED ORDER — PHENYLEPHRINE 40 MCG/ML (10ML) SYRINGE FOR IV PUSH (FOR BLOOD PRESSURE SUPPORT)
PREFILLED_SYRINGE | INTRAVENOUS | Status: AC
  Filled 2017-12-07: qty 10

## 2017-12-07 MED ORDER — ACETAMINOPHEN 325 MG PO TABS: 650.0000 mg | ORAL_TABLET | ORAL | Status: DC | PRN

## 2017-12-07 MED ORDER — EPHEDRINE SULFATE-NACL 50-0.9 MG/10ML-% IV SOSY
PREFILLED_SYRINGE | INTRAVENOUS | Status: DC | PRN
  Administered 2017-12-07: 5 mg via INTRAVENOUS

## 2017-12-07 MED ORDER — MIDAZOLAM HCL 2 MG/2ML IJ SOLN
INTRAMUSCULAR | Status: AC
  Filled 2017-12-07: qty 2

## 2017-12-07 MED ORDER — ROCURONIUM BROMIDE 50 MG/5ML IV SOSY
PREFILLED_SYRINGE | INTRAVENOUS | Status: DC | PRN
  Administered 2017-12-07: 50 mg via INTRAVENOUS

## 2017-12-07 MED ORDER — PROPOFOL 10 MG/ML IV BOLUS
INTRAVENOUS | Status: AC
  Filled 2017-12-07: qty 20

## 2017-12-07 MED ORDER — DEXAMETHASONE SODIUM PHOSPHATE 10 MG/ML IJ SOLN
INTRAMUSCULAR | Status: DC | PRN
  Administered 2017-12-07: 4 mg via INTRAVENOUS

## 2017-12-07 MED ORDER — FUROSEMIDE 20 MG PO TABS
20.0000 mg | ORAL_TABLET | Freq: Every day | ORAL | Status: DC
  Administered 2017-12-07 – 2017-12-08 (×2): 20 mg via ORAL
  Filled 2017-12-07 (×2): qty 1

## 2017-12-07 MED ORDER — ONDANSETRON HCL 4 MG/2ML IJ SOLN
INTRAMUSCULAR | Status: AC
  Filled 2017-12-07: qty 2

## 2017-12-07 SURGICAL SUPPLY — 16 items
BAG URINE DRAINAGE (UROLOGICAL SUPPLIES) ×3 IMPLANT
BAG URO CATCHER STRL LF (MISCELLANEOUS) ×3 IMPLANT
CATH FOLEY 2WAY SLVR 30CC 22FR (CATHETERS) ×3 IMPLANT
CATH FOLEY 3WAY 30CC 22FR (CATHETERS) IMPLANT
CATH URET 5FR 28IN OPEN ENDED (CATHETERS) IMPLANT
GLOVE SURG SS PI 8.0 STRL IVOR (GLOVE) IMPLANT
GOWN STRL REUS W/TWL XL LVL3 (GOWN DISPOSABLE) ×3 IMPLANT
HOLDER FOLEY CATH W/STRAP (MISCELLANEOUS) ×3 IMPLANT
LOOP CUT BIPOLAR 24F LRG (ELECTROSURGICAL) ×3 IMPLANT
MANIFOLD NEPTUNE II (INSTRUMENTS) ×3 IMPLANT
PACK CYSTO (CUSTOM PROCEDURE TRAY) ×3 IMPLANT
SUT ETHILON 3 0 PS 1 (SUTURE) IMPLANT
SYR 30ML LL (SYRINGE) IMPLANT
SYRINGE IRR TOOMEY STRL 70CC (SYRINGE) IMPLANT
TUBING CONNECTING 10 (TUBING) ×2 IMPLANT
TUBING CONNECTING 10' (TUBING) ×1

## 2017-12-07 NOTE — Op Note (Signed)
Procedure: Cystoscopy with restaging TURBT of a medium bladder tumor.  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  Postop diagnosis: Same.  Surgeon: Dr. Irine Seal.  Anesthesia: General.  Specimen: TUR chips from bladder tumor resection bed.  Drain: 8 [place] Foley catheter.  EBL: None.  Complications: None.  Indications: Nathaniel Adams recently underwent resection of a bladder tumor from the left lateral wall, trigone and bladder neck he was found to be a high-grade T1 non-muscle invasive bladder cancer.  He is to undergo a restaging TURBT per guidelines.  Procedure:He was given preoperative antibiotic.  A general anesthetic was induced and he was paralyzed.  He was placed in lithotomy position and fitted with PAS hose.  His perineum and genitalia were prepped with Betadine solution and draped in usual sterile fashion.  Cystoscopy was performed using a 23 [place] scope and 30 degree lens.  Examination of the leg normal urethra.  There is some mild recent scope trauma from the prior procedure at the external sphincter.  The prostatic urethra short without significant obstruction.  Examination of bladder revealed mild trabeculation.  The right ureteral orifice was unremarkable.  The left ureteral orifice was not identified due to its proximity to the prior resection and surrounding inflammatory edema of the trigone.  There was some hemorrhagic changes of the mucosa away from the resection bed but no obvious residual tumors were noted.  The resection bed on the left lateral wall had a normal-appearing eschar.  The cystoscope was replaced with the 26 French continuous-flow resectoscope sheath which was placed with the aid of a visual obturator.  The visual obturator was exchanged for an [place] handle with a bipolar loop and 30 degree lens.  Saline was used to the irrigant.  Resection of the prior resection bed was performed down into  muscle.  Some additional tissue from the periphery of the resection bed at the floor of the bladder and at the bladder neck was obtained as well.  Once there is been adequately resected the chips were removed.  The area of resection was approximately 4 x 4 cm.  The bladder was then drained and reinspected and no active bleeding was identified.  The scope was removed and a 22 French Foley catheter was inserted.  The balloon was filled with 10 cc of sterile fluid and the catheter was placed to straight drainage.  The patient was taken down from lithotomy position, his anesthetic was reversed and he was moved to recovery room in stable condition.  The specimen was sent to pathology for evaluation.  There were no complications.

## 2017-12-07 NOTE — Anesthesia Postprocedure Evaluation (Signed)
Anesthesia Post Note  Patient: Nathaniel Adams  Procedure(s) Performed: RESTAGING TRANSURETHRAL RESECTION OF BLADDER TUMOR (TURBT) (N/A )     Patient location during evaluation: PACU Anesthesia Type: General Level of consciousness: awake and alert Pain management: pain level controlled Vital Signs Assessment: post-procedure vital signs reviewed and stable Respiratory status: spontaneous breathing, nonlabored ventilation, respiratory function stable and patient connected to nasal cannula oxygen Cardiovascular status: blood pressure returned to baseline and stable Postop Assessment: no apparent nausea or vomiting Anesthetic complications: no    Last Vitals:  Vitals:   12/07/17 0933 12/07/17 0949  BP: 137/77 134/80  Pulse: 66 66  Resp: (!) 21 19  Temp: 36.6 C 36.6 C  SpO2: 100% 100%    Last Pain:  Vitals:   12/07/17 0949  TempSrc: Oral  PainSc:                  Tiajuana Amass

## 2017-12-07 NOTE — Interval H&P Note (Signed)
History and Physical Interval Note:  [name] had T1 disease and is back for restaging TURBT.   12/07/2017 7:22 AM  Nathaniel Adams  has presented today for surgery, with the diagnosis of BLADDER MASS  The various methods of treatment have been discussed with the patient and family. After consideration of risks, benefits and other options for treatment, the patient has consented to  Procedure(s): RESTAGING TRANSURETHRAL RESECTION OF BLADDER TUMOR (TURBT) (N/A) as a surgical intervention .  The patient's history has been reviewed, patient examined, no change in status, stable for surgery.  I have reviewed the patient's chart and labs.  Questions were answered to the patient's satisfaction.     Irine Seal

## 2017-12-07 NOTE — Anesthesia Procedure Notes (Signed)
Procedure Name: Intubation Date/Time: 12/07/2017 7:36 AM Performed by: Maxwell Caul, CRNA Pre-anesthesia Checklist: Patient identified, Emergency Drugs available, Suction available and Patient being monitored Patient Re-evaluated:Patient Re-evaluated prior to induction Oxygen Delivery Method: Circle system utilized Preoxygenation: Pre-oxygenation with 100% oxygen Induction Type: IV induction Ventilation: Mask ventilation without difficulty Laryngoscope Size: Mac and 4 Grade View: Grade I Tube type: Oral Tube size: 7.5 mm Number of attempts: 1 Airway Equipment and Method: Stylet and Oral airway Placement Confirmation: ETT inserted through vocal cords under direct vision,  positive ETCO2 and breath sounds checked- equal and bilateral Secured at: 21 cm Tube secured with: Tape Dental Injury: Teeth and Oropharynx as per pre-operative assessment

## 2017-12-07 NOTE — Progress Notes (Signed)
Patient ID: Nathaniel Adams, male   DOB: [birth year], 67 y.o.   MRN: [id number]   He is doing well post op.  Urine is nearly clear.   Labs are ok.  .BP 134/80 (BP Location: Right Arm)   Pulse 66   Temp 97.8 F (36.6 C) (Oral)   Resp 19   Ht 5\' 9"  (1.753 m)   Wt 100.6 kg (221 lb 12.8 oz)   SpO2 100%   BMI 32.75 kg/m   I plan to let him go with the foley removed in the morning.

## 2017-12-07 NOTE — Transfer of Care (Signed)
Immediate Anesthesia Transfer of Care Note  Patient: Nathaniel Adams  Procedure(s) Performed: RESTAGING TRANSURETHRAL RESECTION OF BLADDER TUMOR (TURBT) (N/A )  Patient Location: PACU  Anesthesia Type:General  Level of Consciousness: awake, alert  and oriented  Airway & Oxygen Therapy: Patient Spontanous Breathing and Patient connected to face mask oxygen  Post-op Assessment: Report given to RN and Post -op Vital signs reviewed and stable  Post vital signs: Reviewed and stable  Last Vitals:  Vitals Value Taken Time  BP 141/80 12/07/2017  8:07 AM  Temp    Pulse 67 12/07/2017  8:08 AM  Resp 23 12/07/2017  8:08 AM  SpO2 100 % 12/07/2017  8:08 AM  Vitals shown include unvalidated device data.  Last Pain:  Vitals:   12/07/17 0600  TempSrc:   PainSc: 0-No pain      Patients Stated Pain Goal: 4 (98/33/82 5053)  Complications: No apparent anesthesia complications

## 2017-12-08 ENCOUNTER — Encounter (HOSPITAL_COMMUNITY): Payer: Self-pay | Admitting: Urology

## 2017-12-08 DIAGNOSIS — C672 Malignant neoplasm of lateral wall of bladder: Secondary | ICD-10-CM | POA: Diagnosis not present

## 2017-12-08 LAB — BASIC METABOLIC PANEL
Anion gap: 9 (ref 5–15)
BUN: 23 mg/dL (ref 8–23)
CO2: 27 mmol/L (ref 22–32)
CREATININE: 1.33 mg/dL — AB (ref 0.61–1.24)
Calcium: 8.8 mg/dL — ABNORMAL LOW (ref 8.9–10.3)
Chloride: 98 mmol/L (ref 98–111)
GFR calc Af Amer: >60 mL/min (ref >60)
GFR, EST NON AFRICAN AMERICAN: 53 mL/min — AB (ref >60)
Glucose, Bld: 225 mg/dL — ABNORMAL HIGH (ref 70–99)
Potassium: 4.8 mmol/L (ref 3.5–5.1)
SODIUM: 134 mmol/L — AB (ref 135–145)

## 2017-12-08 LAB — GLUCOSE, CAPILLARY: Glucose-Capillary: 198 mg/dL — ABNORMAL HIGH (ref 70–99)

## 2017-12-08 NOTE — Discharge Summary (Signed)
Physician Discharge Summary  Patient ID: Nathaniel Adams MRN: [id number] DOB/AGE: [birth year] 67 y.o.  Admit date: 12/07/2017 Discharge date: 12/08/2017  Admission Diagnoses:  Bladder cancer [facility])  Discharge Diagnoses:  Principal Problem:   Bladder cancer [facility])   Past Medical History:  Diagnosis Date  . Bladder mass    left wall bladder mass   . CAD (coronary artery disease)   . COPD (chronic obstructive pulmonary disease) ([place])   . Diabetes ([place])    type 2   . Hx of pleural effusion 11/2017   s/p thoracentesis  at [facility] [place] ; 11-19-17 reports no orthopnea, no coughing, no chest pain , no SOB with exertion , 02 resting 100%  . Myocardial infarction ([place]) 2006   blood clot caused heart attack   . OSA on CPAP 08/16/2017   diagnose 08/16/17  . Peripheral vascular disease ([place]) 2006   hx of blood clot causing MI     Surgeries: Procedure(s): RESTAGING TRANSURETHRAL RESECTION OF BLADDER TUMOR (TURBT) on 12/07/2017   Consultants (if any):   Discharged Condition: Improved  Hospital Course: Nathaniel Adams is an 67 y.o. male who was admitted 12/07/2017 with a diagnosis of Bladder cancer ([place]) and went to the operating room on 12/07/2017 and underwent the above named procedures.  He did well overnight with good UOP and clear urine.  His electrolytes remain normal and his Cr is stable at 1.33.  He was felt to be ready for discharge.  He was given perioperative antibiotics:  Anti-infectives (From admission, onward)   Start     Dose/Rate Route Frequency Ordered Stop   12/07/17 0540  ceFAZolin (ANCEF) IVPB 2g/100 mL premix     2 g 200 mL/hr over 30 Minutes Intravenous 30 min pre-op 12/07/17 0540 12/07/17 0752    .  He was given sequential compression devices for DVT prophylaxis.  He benefited maximally from the hospital stay and there were no complications.    Recent vital signs:  Vitals:   12/07/17 2039 12/08/17 0507  BP: (!) 114/57 126/63  Pulse: 76 73  Resp: (!) 22 20   Temp: 98 F (36.7 C) (!) 97.5 F (36.4 C)  SpO2: 98% 97%    Recent laboratory studies:  Lab Results  Component Value Date   HGB 9.0 (L) 11/26/2017   HGB 10.0 (L) 11/19/2017   HGB 9.8 (L) 11/07/2017   Lab Results  Component Value Date   WBC 7.3 11/26/2017   PLT 147 (L) 11/26/2017   Lab Results  Component Value Date   INR 1.15 12/07/2017   Lab Results  Component Value Date   NA 134 (L) 12/08/2017   K 4.8 12/08/2017   CL 98 12/08/2017   CO2 27 12/08/2017   BUN 23 12/08/2017   CREATININE 1.33 (H) 12/08/2017   GLUCOSE 225 (H) 12/08/2017    Discharge Medications:   Allergies as of 12/08/2017      Reactions   Pollen Extract    Ace Inhibitors    Pt states "cough"      Medication List    TAKE these medications   acetaminophen 500 MG tablet Commonly known as:  TYLENOL Take 1,000 mg by mouth every 8 (eight) hours as needed for mild pain or headache.   aspirin 81 MG tablet Take 81 mg by mouth daily.   atorvastatin 80 MG tablet Commonly known as:  LIPITOR Take 40 mg by mouth daily at 6 PM.   carvedilol 25 MG tablet Commonly known as:  COREG Take 37.5 mg by mouth 2 (two) times daily with a meal.   cholecalciferol 1000 units tablet Commonly known as:  VITAMIN D Take 1,000 Units by mouth daily.   digoxin 0.125 MG tablet Commonly known as:  LANOXIN Take 0.125 mg by mouth daily.   fluticasone 50 MCG/ACT nasal spray Commonly known as:  FLONASE Place 2 sprays into both nostrils daily.   furosemide 20 MG tablet Commonly known as:  LASIX Take 20 mg by mouth daily.   insulin aspart 100 UNIT/ML injection Commonly known as:  novoLOG Inject 10-15 Units into the skin See admin instructions. Taking 10 units in the AM before Breakfast, 15 units at Lunch and 12 units in the PM.   insulin glargine 100 UNIT/ML injection Commonly known as:  LANTUS Inject 0.24 mLs (24 Units total) into the skin at bedtime.   loratadine 10 MG tablet Commonly known as:  CLARITIN Take  10 mg by mouth daily.   metFORMIN 1000 MG tablet Commonly known as:  GLUCOPHAGE Take 1,000 mg by mouth 2 (two) times daily with a meal.   spironolactone 25 MG tablet Commonly known as:  ALDACTONE Take 1 tablet (25 mg total) by mouth daily.   traMADol 50 MG tablet Commonly known as:  ULTRAM Take 1 tablet (50 mg total) by mouth every 6 (six) hours as needed.   valsartan 80 MG tablet Commonly known as:  DIOVAN Take 0.5 tablets (40 mg total) by mouth 2 (two) times daily.   warfarin 5 MG tablet Commonly known as:  COUMADIN Take 1-1.5 tablets (5-7.5 mg total) by mouth daily. Take one tablet by mouth once daily except take one an one-half tablets Wednesday       Diagnostic Studies: Dg Chest Port 1 View  Result Date: 11/24/2017 CLINICAL DATA:  Follow-up pleural effusions. EXAM: PORTABLE CHEST 1 VIEW COMPARISON:  Chest x-ray 11/07/2017. Visualized lung bases on CT abdomen and pelvis 11/06/2017. FINDINGS: Cardiac silhouette moderately enlarged for AP portable technique. LEFT subclavian pacing defibrillator. Thoracic aorta atherosclerotic. Pulmonary venous hypertension without overt pulmonary edema. Moderate-sized LEFT pleural effusion, recurrent after the prior thoracentesis. Dense consolidation in the LEFT LOWER LOBE with worsening aeration since the prior exam. Small RIGHT pleural effusion, unchanged. IMPRESSION: 1. Recurrent LEFT pleural effusion. Stable small RIGHT pleural effusion. 2. Passive atelectasis and/or pneumonia involving the LEFT LOWER LOBE. 3. Stable cardiomegaly. Pulmonary venous hypertension without overt edema. Electronically Signed   By: Evangeline Dakin M.D.   On: 11/24/2017 11:14   Dg C-arm 1-60 Min-no Report  Result Date: 11/23/2017 Fluoroscopy was utilized by the requesting physician.  No radiographic interpretation.    Disposition: Discharge disposition: 01-Home or Self Care       Discharge Instructions    Discontinue IV   Complete by:  As directed        Follow-up Information    Irine Seal, MD On 12/15/2017.   Specialty:  Urology Why:  [address] information: [place] [address] [phone]            Signed: Irine Seal 12/08/2017, 6:45 AM

## 2019-01-08 IMAGING — DX DG CHEST 1V PORT
1 series · 1 of 1 positions shown · non-contrast
Comparison: Chest x-ray 11/07/2017. Visualized lung bases on CT
abdomen and pelvis 11/06/2017.

CLINICAL DATA: Follow-up pleural effusions.

EXAM:
PORTABLE CHEST 1 VIEW

[chest ap]
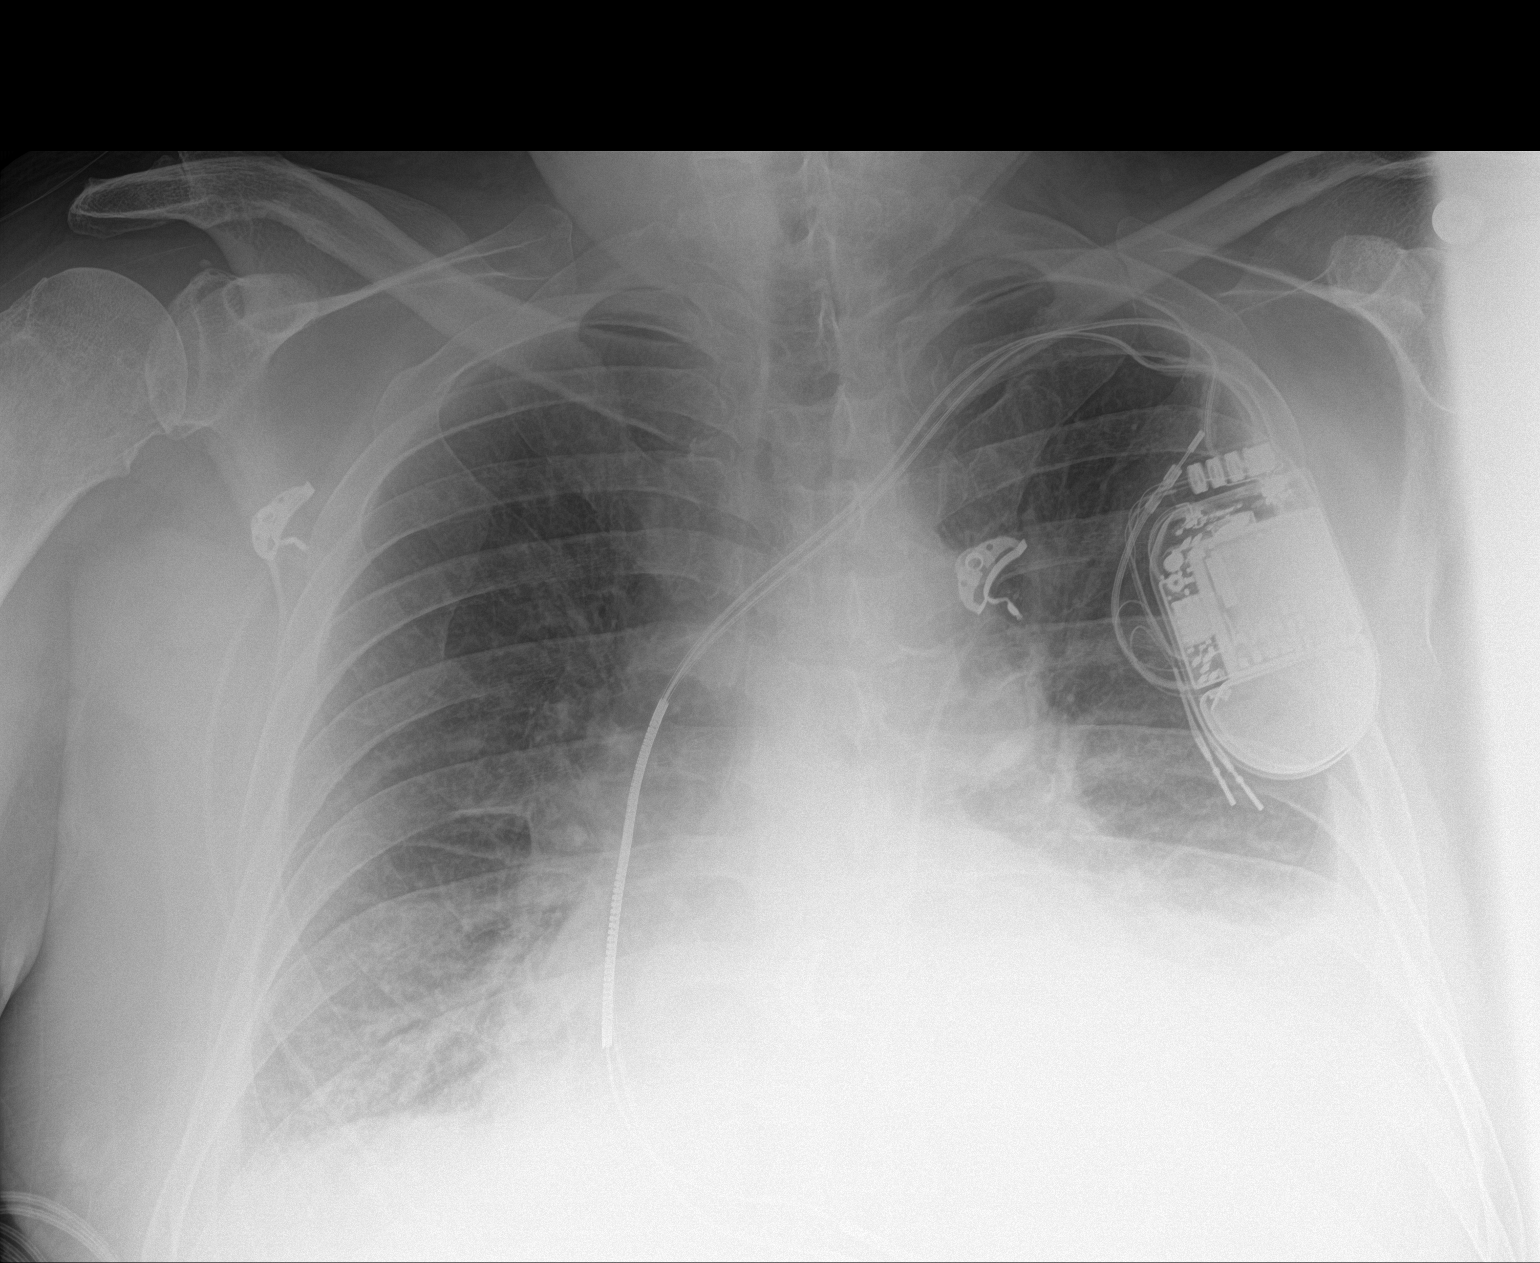

[1 of 1 positions shown; findings below may reference images not displayed]

FINDINGS: Cardiac silhouette moderately enlarged for AP portable technique.
LEFT subclavian pacing defibrillator. Thoracic aorta
atherosclerotic. Pulmonary venous hypertension without overt
pulmonary edema. Moderate-sized LEFT pleural effusion, recurrent
after the prior thoracentesis. Dense consolidation in the LEFT LOWER
LOBE with worsening aeration since the prior exam. Small RIGHT
pleural effusion, unchanged.
IMPRESSION: 1. Recurrent LEFT pleural effusion. Stable small RIGHT pleural
effusion.
2. Passive atelectasis and/or pneumonia involving the LEFT LOWER
LOBE.
3. Stable cardiomegaly. Pulmonary venous hypertension without overt
edema.
